# Patient Record
Sex: Female | Born: 1935 | Race: Black or African American | Hispanic: No
Health system: Southern US, Community
[De-identification: ages and names within clinical notes are randomized; demographics above are authoritative.]

## PROBLEM LIST (undated history)

## (undated) DIAGNOSIS — M1990 Unspecified osteoarthritis, unspecified site: Secondary | ICD-10-CM

## (undated) DIAGNOSIS — M109 Gout, unspecified: Secondary | ICD-10-CM

## (undated) DIAGNOSIS — F0390 Unspecified dementia without behavioral disturbance: Secondary | ICD-10-CM

## (undated) DIAGNOSIS — R1310 Dysphagia, unspecified: Secondary | ICD-10-CM

## (undated) DIAGNOSIS — D649 Anemia, unspecified: Secondary | ICD-10-CM

## (undated) DIAGNOSIS — E538 Deficiency of other specified B group vitamins: Secondary | ICD-10-CM

## (undated) DIAGNOSIS — I739 Peripheral vascular disease, unspecified: Secondary | ICD-10-CM

## (undated) DIAGNOSIS — I10 Essential (primary) hypertension: Secondary | ICD-10-CM

## (undated) DIAGNOSIS — R569 Unspecified convulsions: Secondary | ICD-10-CM

## (undated) HISTORY — DX: Unspecified osteoarthritis, unspecified site: M19.90

## (undated) HISTORY — DX: Unspecified convulsions: R56.9

## (undated) HISTORY — DX: Essential (primary) hypertension: I10

## (undated) HISTORY — DX: Deficiency of other specified B group vitamins: E53.8

## (undated) HISTORY — DX: Dysphagia, unspecified: R13.10

## (undated) HISTORY — DX: Peripheral vascular disease, unspecified: I73.9

## (undated) HISTORY — DX: Anemia, unspecified: D64.9

## (undated) HISTORY — DX: Gout, unspecified: M10.9

## (undated) HISTORY — DX: Unspecified dementia, unspecified severity, without behavioral disturbance, psychotic disturbance, mood disturbance, and anxiety: F03.90

---

## 2014-06-06 ENCOUNTER — Encounter: Payer: Self-pay | Admitting: Internal Medicine

## 2014-06-06 ENCOUNTER — Non-Acute Institutional Stay (SKILLED_NURSING_FACILITY): Payer: Medicaid Other | Admitting: Internal Medicine

## 2014-06-06 DIAGNOSIS — M1A9XX0 Chronic gout, unspecified, without tophus (tophi): Secondary | ICD-10-CM

## 2014-06-06 DIAGNOSIS — B354 Tinea corporis: Secondary | ICD-10-CM

## 2014-06-06 DIAGNOSIS — M1990 Unspecified osteoarthritis, unspecified site: Secondary | ICD-10-CM | POA: Insufficient documentation

## 2014-06-06 DIAGNOSIS — I10 Essential (primary) hypertension: Secondary | ICD-10-CM

## 2014-06-06 DIAGNOSIS — F03918 Unspecified dementia, unspecified severity, with other behavioral disturbance: Secondary | ICD-10-CM

## 2014-06-06 DIAGNOSIS — R0902 Hypoxemia: Secondary | ICD-10-CM

## 2014-06-06 DIAGNOSIS — M109 Gout, unspecified: Secondary | ICD-10-CM | POA: Insufficient documentation

## 2014-06-06 DIAGNOSIS — F0391 Unspecified dementia with behavioral disturbance: Secondary | ICD-10-CM | POA: Insufficient documentation

## 2014-06-06 DIAGNOSIS — J9611 Chronic respiratory failure with hypoxia: Secondary | ICD-10-CM

## 2014-06-06 DIAGNOSIS — J9610 Chronic respiratory failure, unspecified whether with hypoxia or hypercapnia: Secondary | ICD-10-CM

## 2014-06-06 DIAGNOSIS — M1A00X0 Idiopathic chronic gout, unspecified site, without tophus (tophi): Secondary | ICD-10-CM

## 2014-06-06 NOTE — Progress Notes (Signed)
MRN: [id number] Name: Kaitlyn Curry  Sex: female Age: 80 y.o. DOB: 1928/07/25  [facility] #: [facility] Facility/Room: 205D Level Of Care: SNF Provider: Merrilee Seashore D Emergency Contacts: No emergency contact information on file.  Code Status: DNR  Allergies: Ppd and Shellfish allergy  Chief Complaint  Patient presents with  . New Admit To SNF    HPI: Patient is 80 y.o. female who is admitted to SNF from another facility in [place].  Past Medical History  Diagnosis Date  . Dementia without behavioral disturbance   . Arthritis   . Gout   . Dysphagia   . PVD (peripheral vascular disease)   . Hypertension   . Anemia   . Vitamin B12 deficiency   . Seizures     No past surgical history on file.    Medication List       This list is accurate as of: 06/06/14 11:59 PM.  Always use your most recent med list.               allopurinol 100 MG tablet  Commonly known as:  ZYLOPRIM  Take 100 mg by mouth daily.     amLODipine 10 MG tablet  Commonly known as:  NORVASC  Take 10 mg by mouth daily.     ammonium lactate 12 % lotion  Commonly known as:  LAC-HYDRIN  Apply 1 application topically daily.     atropine 1 % ophthalmic solution  Place 2 drops under the tongue 4 (four) times daily as needed.     CALCIUM CITRATE + D3 250-200 MG-UNIT Tabs  Generic drug:  Calcium Citrate-Vitamin D  Take 2 tablets by mouth 2 (two) times daily.     chlorhexidine 0.12 % solution  Commonly known as:  PERIDEX  Use as directed 15 mLs in the mouth or throat 2 (two) times daily.     ciclopirox 8 % solution  Commonly known as:  PENLAC  Apply topically at bedtime. Apply over nail and surrounding skin. Apply daily over previous coat. After seven (7) days, may remove with alcohol and continue cycle.     [employer] SALINE NASAL SPRAY NA  Place 1 spray into the nose 3 (three) times daily.     diclofenac sodium 1 % Gel  Commonly known as:  VOLTAREN  Apply 2 g topically daily. Knees and L hip     guaiFENesin 100 MG/5ML liquid  Commonly known as:  ROBITUSSIN  Take 200 mg by mouth 4 (four) times daily as needed for cough.     ipratropium-albuterol 0.5-2.5 (3) MG/3ML Soln  Commonly known as:  DUONEB  Take 3 mLs by nebulization 3 (three) times daily.     iron polysaccharides 150 MG capsule  Commonly known as:  NIFEREX  Take 150 mg by mouth every other day.     IRON-B12-VITAMINS IM  Inject 1 mL into the muscle every 7 (seven) days.     lactulose 10 GM/15ML solution  Commonly known as:  CHRONULAC  Take 45 g by mouth 3 (three) times daily.     lamoTRIgine 100 MG tablet  Commonly known as:  LAMICTAL  Take 50 mg by mouth 2 (two) times daily.     LORazepam 0.5 MG tablet  Commonly known as:  ATIVAN  Take 0.5 mg by mouth once. Prior to dr's appt     LORazepam 0.5 MG tablet  Commonly known as:  ATIVAN  Take 0.5 mg by mouth every 4 (four) hours as needed for anxiety.  multivitamin capsule  Take 1 capsule by mouth daily.     nystatin 100000 UNIT/GM Powd  Apply topically 2 (two) times daily.     OXYGEN  Inhale 2 L into the lungs as needed. O2<93     ranitidine 150 MG tablet  Commonly known as:  ZANTAC  Take 150 mg by mouth daily as needed for heartburn.     triamterene-hydrochlorothiazide 37.5-25 MG per tablet  Commonly known as:  MAXZIDE-25  Take 1 tablet by mouth daily.     WATER ORAL PO  Take 240 mLs by mouth 3 (three) times daily.        Meds ordered this encounter  Medications  . LORazepam (ATIVAN) 0.5 MG tablet    Sig: Take 0.5 mg by mouth once. Prior to dr's appt  . allopurinol (ZYLOPRIM) 100 MG tablet    Sig: Take 100 mg by mouth daily.  Marland Kitchen amLODipine (NORVASC) 10 MG tablet    Sig: Take 10 mg by mouth daily.  . [employer]S SALINE NASAL SPRAY NA    Sig: Place 1 spray into the nose 3 (three) times daily.  . Calcium Citrate-Vitamin D (CALCIUM CITRATE + D3) 250-200 MG-UNIT TABS    Sig: Take 2 tablets by mouth 2 (two) times daily.  . chlorhexidine (PERIDEX) 0.12  % solution    Sig: Use as directed 15 mLs in the mouth or throat 2 (two) times daily.  Marland Kitchen IRON-B12-VITAMINS IM    Sig: Inject 1 mL into the muscle every 7 (seven) days.  . iron polysaccharides (NIFEREX) 150 MG capsule    Sig: Take 150 mg by mouth every other day.  . lactulose (CHRONULAC) 10 GM/15ML solution    Sig: Take 45 g by mouth 3 (three) times daily.  Marland Kitchen lamoTRIgine (LAMICTAL) 100 MG tablet    Sig: Take 50 mg by mouth 2 (two) times daily.  . Multiple Vitamin (MULTIVITAMIN) capsule    Sig: Take 1 capsule by mouth daily.  Marland Kitchen triamterene-hydrochlorothiazide (MAXZIDE-25) 37.5-25 MG per tablet    Sig: Take 1 tablet by mouth daily.  Marland Kitchen ipratropium-albuterol (DUONEB) 0.5-2.5 (3) MG/3ML SOLN    Sig: Take 3 mLs by nebulization 3 (three) times daily.  [employer] (WATER ORAL PO)    Sig: Take 240 mLs by mouth 3 (three) times daily.  Marland Kitchen ammonium lactate (LAC-HYDRIN) 12 % lotion    Sig: Apply 1 application topically daily.  . diclofenac sodium (VOLTAREN) 1 % GEL    Sig: Apply 2 g topically daily. Knees and L hip  . nystatin (MYCOSTATIN/NYSTOP) 100000 UNIT/GM POWD    Sig: Apply topically 2 (two) times daily.  . ciclopirox (PENLAC) 8 % solution    Sig: Apply topically at bedtime. Apply over nail and surrounding skin. Apply daily over previous coat. After seven (7) days, may remove with alcohol and continue cycle.  Marland Kitchen atropine 1 % ophthalmic solution    Sig: Place 2 drops under the tongue 4 (four) times daily as needed.  Marland Kitchen guaiFENesin (ROBITUSSIN) 100 MG/5ML liquid    Sig: Take 200 mg by mouth 4 (four) times daily as needed for cough.  . ranitidine (ZANTAC) 150 MG tablet    Sig: Take 150 mg by mouth daily as needed for heartburn.  Marland Kitchen LORazepam (ATIVAN) 0.5 MG tablet    Sig: Take 0.5 mg by mouth every 4 (four) hours as needed for anxiety.  . OXYGEN    Sig: Inhale 2 L into the lungs as needed. O2<93  There is no immunization history on file for this patient.  History  Substance Use  Topics  . Smoking status: Unknown If Ever Smoked  . Smokeless tobacco: Not on file  . Alcohol Use: Not on file    Family history is noncontributory    Review of Systems    UTO   Filed Vitals:   06/06/14 2109  BP: 131/79  Pulse: 82  Temp: 97 F (36.1 C)  Resp: 18    Physical Exam  GENERAL APPEARANCE: Alert, non conversant. No acute distress.  SKIN: No diaphoresis rash,  HEAD: Normocephalic, atraumatic  EYES: pt did not open eyes  EARS: External exam WNL, canals clear NOSE: No deformity or discharge.  MOUTH/THROAT: Lips w/o lesions.  RESPIRATORY: Breathing is even, unlabored. Lung sounds are clear   CARDIOVASCULAR: Heart RRR no murmurs, rubs or gallops. No peripheral edema.  GASTROINTESTINAL: Abdomen is soft, non-tender, not distended w/ normal bowel sounds GENITOURINARY: Bladder non tender, not distended  MUSCULOSKELETAL: No abnormal joints or musculature NEUROLOGIC: Cranial nerves 2-12 grossly intact. Moves all extremities no  PSYCHIATRIC: dementia, no behavioral issues  Patient Active Problem List   Diagnosis Date Noted  . Tinea corporis 06/08/2014  . Chronic respiratory failure with hypoxia 06/08/2014  . Dementia with behavioral disturbance   . Arthritis   . Gout   . Hypertension        Assessment and Plan  Hypertension Continue maxide and norvasc  Dementia with behavioral disturbance Continue ativan and lamictal  Gout Continue allopurinol  Tinea corporis Continue nystatin  Chronic respiratory failure with hypoxia Continue duoneb and O2    Margit Hanks, MD

## 2014-06-08 ENCOUNTER — Encounter: Payer: Self-pay | Admitting: Internal Medicine

## 2014-06-08 DIAGNOSIS — B352 Tinea manuum: Secondary | ICD-10-CM

## 2014-06-08 DIAGNOSIS — J9611 Chronic respiratory failure with hypoxia: Secondary | ICD-10-CM | POA: Insufficient documentation

## 2014-06-08 DIAGNOSIS — B353 Tinea pedis: Secondary | ICD-10-CM

## 2014-06-08 DIAGNOSIS — B351 Tinea unguium: Secondary | ICD-10-CM | POA: Insufficient documentation

## 2014-06-08 NOTE — Assessment & Plan Note (Signed)
Continue allopurinol 

## 2014-06-08 NOTE — Assessment & Plan Note (Signed)
Continue maxide and norvasc

## 2014-06-08 NOTE — Assessment & Plan Note (Signed)
Continue ativan and lamictal

## 2014-06-08 NOTE — Assessment & Plan Note (Signed)
Continue duoneb and O2

## 2014-06-08 NOTE — Assessment & Plan Note (Signed)
Continue nystatin °

## 2014-06-11 ENCOUNTER — Other Ambulatory Visit: Payer: Self-pay | Admitting: *Deleted

## 2014-06-11 MED ORDER — LORAZEPAM 0.5 MG PO TABS: 0.5000 mg | ORAL_TABLET | ORAL | Status: DC | PRN

## 2014-06-11 NOTE — Telephone Encounter (Signed)
[facility] 

## 2014-06-14 ENCOUNTER — Emergency Department (HOSPITAL_COMMUNITY)
Admission: EM | Admit: 2014-06-14 | Discharge: 2014-06-15 | Disposition: A | Discharge status: Skilled Nursing Facility | Payer: Medicare Other | Attending: Emergency Medicine | Admitting: Emergency Medicine

## 2014-06-14 ENCOUNTER — Emergency Department (HOSPITAL_COMMUNITY): Payer: Medicare Other

## 2014-06-14 ENCOUNTER — Encounter (HOSPITAL_COMMUNITY): Payer: Self-pay | Admitting: Emergency Medicine

## 2014-06-14 DIAGNOSIS — W06XXXA Fall from bed, initial encounter: Secondary | ICD-10-CM | POA: Insufficient documentation

## 2014-06-14 DIAGNOSIS — I10 Essential (primary) hypertension: Secondary | ICD-10-CM | POA: Insufficient documentation

## 2014-06-14 DIAGNOSIS — S0990XA Unspecified injury of head, initial encounter: Secondary | ICD-10-CM | POA: Diagnosis not present

## 2014-06-14 DIAGNOSIS — Z862 Personal history of diseases of the blood and blood-forming organs and certain disorders involving the immune mechanism: Secondary | ICD-10-CM | POA: Insufficient documentation

## 2014-06-14 DIAGNOSIS — Z8739 Personal history of other diseases of the musculoskeletal system and connective tissue: Secondary | ICD-10-CM | POA: Diagnosis not present

## 2014-06-14 DIAGNOSIS — F0390 Unspecified dementia without behavioral disturbance: Secondary | ICD-10-CM | POA: Diagnosis not present

## 2014-06-14 DIAGNOSIS — W19XXXA Unspecified fall, initial encounter: Secondary | ICD-10-CM

## 2014-06-14 DIAGNOSIS — Y9389 Activity, other specified: Secondary | ICD-10-CM | POA: Diagnosis not present

## 2014-06-14 DIAGNOSIS — M109 Gout, unspecified: Secondary | ICD-10-CM | POA: Insufficient documentation

## 2014-06-14 DIAGNOSIS — Z79899 Other long term (current) drug therapy: Secondary | ICD-10-CM | POA: Diagnosis not present

## 2014-06-14 DIAGNOSIS — Y9289 Other specified places as the place of occurrence of the external cause: Secondary | ICD-10-CM | POA: Insufficient documentation

## 2014-06-14 DIAGNOSIS — G40909 Epilepsy, unspecified, not intractable, without status epilepticus: Secondary | ICD-10-CM | POA: Insufficient documentation

## 2014-06-14 DIAGNOSIS — Y92129 Unspecified place in nursing home as the place of occurrence of the external cause: Secondary | ICD-10-CM

## 2014-06-14 NOTE — ED Notes (Signed)
Pt from [facility]. Has history of severe dementia. Rolled off bed and found on floor around 1930 this evening. Pt is nonverbal. Has hematoma to right cheek and eye with old blood in right nostril. Family wants social worker to place pt in another facility and requests that pt stay in hospital until social work can do so. Pt is contracted ann accurate BP and O2 sats unable to be obtained due to pt's contracture of arms.

## 2014-06-14 NOTE — ED Provider Notes (Signed)
CSN: [id number]     Arrival date & time 06/14/14  2035 History   First MD Initiated Contact with Patient 06/14/14 2052     Chief Complaint  Patient presents with  . Fall     (Consider location/radiation/quality/duration/timing/severity/associated sxs/prior Treatment) HPI Comments: Patient is brought from [facility] after a fall. She has a history of marked dementia. She is nonverbal and nonambulatory. She was found on the floor after she rolled out of bed this evening. Per the family she is at her normal mental status. She is normally nonverbal. The daughter was with the patient earlier today and said that she was at her baseline mental status. She's not had any recent fevers vomiting or other recent illnesses. History is limited due to dementia.   Past Medical History  Diagnosis Date  . Dementia without behavioral disturbance   . Arthritis   . Gout   . Dysphagia   . PVD (peripheral vascular disease)   . Hypertension   . Anemia   . Vitamin B12 deficiency   . Seizures    History reviewed. No pertinent past surgical history. No family history on file. History  Substance Use Topics  . Smoking status: Unknown If Ever Smoked  . Smokeless tobacco: Not on file  . Alcohol Use: Not on file   OB History   Grav Para Term Preterm Abortions TAB SAB Ect Mult Living                 Review of Systems  Unable to perform ROS: Dementia      Allergies  Ppd and Shellfish allergy  Home Medications   Prior to Admission medications   Medication Sig Start Date End Date Taking? Authorizing Provider  allopurinol (ZYLOPRIM) 100 MG tablet Take 100 mg by mouth daily.   Yes Historical Provider, MD  lactulose (CHRONULAC) 10 GM/15ML solution Take 45 g by mouth 3 (three) times daily.   Yes Historical Provider, MD  lamoTRIgine (LAMICTAL) 100 MG tablet Take 50 mg by mouth 2 (two) times daily.   Yes Historical Provider, MD  triamterene-hydrochlorothiazide (MAXZIDE-25) 37.5-25 MG per  tablet Take 1 tablet by mouth daily.   Yes Historical Provider, MD   BP 138/78  Pulse 80  Temp(Src) 98.2 F (36.8 C) (Oral)  Resp 22  Ht 5\' 6"  (1.676 m)  Wt 152 lb (68.947 kg)  BMI 24.55 kg/m2  SpO2 99% Physical Exam  Constitutional: She appears well-developed and well-nourished.  HENT:  Head: Normocephalic.  Abrasion, small hematoma to frontal scalp  Eyes: Pupils are equal, round, and reactive to light.  Neck:  No obvious tenderness to cervical thoracic or lumbosacral spine  Cardiovascular: Normal rate, regular rhythm and normal heart sounds.   Pulmonary/Chest: Effort normal and breath sounds normal. No respiratory distress. She has no wheezes. She has no rales. She exhibits no tenderness.  Abdominal: Soft. Bowel sounds are normal. There is no tenderness. There is no rebound and no guarding.  Musculoskeletal: Normal range of motion. She exhibits no edema.  Patient with contractures not extremities but no evident discomfort on movement of extremities. There's no areas of deformity or swelling noted.  Lymphadenopathy:    She has no cervical adenopathy.  Neurological:  Pt with eyes closed, mouth breathing.  Contractures to all extremities.  Nonverbal. No facial drooping.  Does not follow commands.  Skin: Skin is warm and dry. No rash noted.  She has an area of skin break down to her sacrum  Psychiatric: She  has a normal mood and affect.    ED Course  Procedures (including critical care time) Labs Review Labs Reviewed  URINE CULTURE  CBC WITH DIFFERENTIAL  COMPREHENSIVE METABOLIC PANEL  URINALYSIS, ROUTINE W REFLEX MICROSCOPIC    Imaging Review Dg Chest 1 View  06/14/2014   CLINICAL DATA:  Left rib pain.  EXAM: CHEST - 1 VIEW  COMPARISON:  None.  FINDINGS: There is marked elevation of the left hemidiaphragm, with left-sided atelectasis. The right lung appears clear. No definite pleural effusion or pneumothorax is seen.  The cardiomediastinal silhouette is borderline normal  in size. No displaced rib fractures are seen.  IMPRESSION: Marked elevation of the left hemidiaphragm, with left-sided atelectasis. No displaced rib fracture seen.   Electronically Signed   By: Roanna Raider M.D.   On: 06/14/2014 22:04   Ct Head Wo Contrast  06/14/2014   CLINICAL DATA:  Rolled off bed; right cheek hematoma and periorbital swelling. Concern for head or cervical spine injury.  EXAM: CT HEAD WITHOUT CONTRAST  CT MAXILLOFACIAL WITHOUT CONTRAST  CT CERVICAL SPINE WITHOUT CONTRAST  TECHNIQUE: Multidetector CT imaging of the head, cervical spine, and maxillofacial structures were performed using the standard protocol without intravenous contrast. Multiplanar CT image reconstructions of the cervical spine and maxillofacial structures were also generated.  COMPARISON:  None.  FINDINGS: CT HEAD FINDINGS  There is no evidence of acute infarction, mass lesion, or intra- or extra-axial hemorrhage on CT.  Prominence of the ventricles and sulci reflects moderately severe cortical volume loss. A region of chronic encephalomalacia is noted at the left temporal lobe. Cerebellar atrophy is noted. Diffuse periventricular and subcortical white matter change reflects small vessel ischemic microangiopathy.  The brainstem and fourth ventricle are within normal limits. The basal ganglia are unremarkable in appearance. No mass effect or midline shift is seen.  There is no evidence of fracture; a surgical defect is noted at the high left parietal calvarium. The visualized portions of the orbits are within normal limits. The paranasal sinuses and mastoid air cells are well-aerated. No significant soft tissue abnormalities are seen.  CT MAXILLOFACIAL FINDINGS  There is no evidence of fracture or dislocation. The maxilla and mandible appear intact. The nasal bone is unremarkable in appearance. The visualized dentition demonstrates no acute abnormality. A tiny osseous fragment adjacent to the left temporomandibular joint may  reflect remote injury or degenerative change.  The orbits are intact bilaterally. The visualized paranasal sinuses and mastoid air cells are well-aerated.  There is question of a laceration at the right eyelids. The parapharyngeal fat planes are preserved. The nasopharynx, oropharynx and hypopharynx are unremarkable in appearance. The visualized portions of the valleculae and piriform sinuses are grossly unremarkable.  The parotid and submandibular glands are within normal limits. No cervical lymphadenopathy is seen.  CT CERVICAL SPINE FINDINGS  There is no evidence of fracture or subluxation. Vertebral bodies demonstrate normal height and alignment. Intervertebral disc spaces are preserved. Anterior bridging osteophytes are noted at C6-C7. Prevertebral soft tissues are within normal limits. The visualized neural foramina are grossly unremarkable.  The thyroid gland is unremarkable in appearance. The visualized lung apices are clear. No significant soft tissue abnormalities are seen.  IMPRESSION: 1. No evidence of traumatic intracranial injury or fracture. 2. No evidence of fracture or dislocation evident maxillofacial structures. 3. No evidence of fracture or subluxation along the cervical spine. 4. Question of laceration at the right eyelids. 5. Moderately severe cortical volume loss. Chronic encephalomalacia noted at the left temporal  lobe. Diffuse small vessel ischemic microangiopathy seen. 6. Tiny osseous fragment adjacent to the left temporomandibular joint may reflect remote injury or degenerative change.   Electronically Signed   By: Roanna Raider M.D.   On: 06/14/2014 22:24   Ct Cervical Spine Wo Contrast  06/14/2014   CLINICAL DATA:  Rolled off bed; right cheek hematoma and periorbital swelling. Concern for head or cervical spine injury.  EXAM: CT HEAD WITHOUT CONTRAST  CT MAXILLOFACIAL WITHOUT CONTRAST  CT CERVICAL SPINE WITHOUT CONTRAST  TECHNIQUE: Multidetector CT imaging of the head, cervical  spine, and maxillofacial structures were performed using the standard protocol without intravenous contrast. Multiplanar CT image reconstructions of the cervical spine and maxillofacial structures were also generated.  COMPARISON:  None.  FINDINGS: CT HEAD FINDINGS  There is no evidence of acute infarction, mass lesion, or intra- or extra-axial hemorrhage on CT.  Prominence of the ventricles and sulci reflects moderately severe cortical volume loss. A region of chronic encephalomalacia is noted at the left temporal lobe. Cerebellar atrophy is noted. Diffuse periventricular and subcortical white matter change reflects small vessel ischemic microangiopathy.  The brainstem and fourth ventricle are within normal limits. The basal ganglia are unremarkable in appearance. No mass effect or midline shift is seen.  There is no evidence of fracture; a surgical defect is noted at the high left parietal calvarium. The visualized portions of the orbits are within normal limits. The paranasal sinuses and mastoid air cells are well-aerated. No significant soft tissue abnormalities are seen.  CT MAXILLOFACIAL FINDINGS  There is no evidence of fracture or dislocation. The maxilla and mandible appear intact. The nasal bone is unremarkable in appearance. The visualized dentition demonstrates no acute abnormality. A tiny osseous fragment adjacent to the left temporomandibular joint may reflect remote injury or degenerative change.  The orbits are intact bilaterally. The visualized paranasal sinuses and mastoid air cells are well-aerated.  There is question of a laceration at the right eyelids. The parapharyngeal fat planes are preserved. The nasopharynx, oropharynx and hypopharynx are unremarkable in appearance. The visualized portions of the valleculae and piriform sinuses are grossly unremarkable.  The parotid and submandibular glands are within normal limits. No cervical lymphadenopathy is seen.  CT CERVICAL SPINE FINDINGS  There is  no evidence of fracture or subluxation. Vertebral bodies demonstrate normal height and alignment. Intervertebral disc spaces are preserved. Anterior bridging osteophytes are noted at C6-C7. Prevertebral soft tissues are within normal limits. The visualized neural foramina are grossly unremarkable.  The thyroid gland is unremarkable in appearance. The visualized lung apices are clear. No significant soft tissue abnormalities are seen.  IMPRESSION: 1. No evidence of traumatic intracranial injury or fracture. 2. No evidence of fracture or dislocation evident maxillofacial structures. 3. No evidence of fracture or subluxation along the cervical spine. 4. Question of laceration at the right eyelids. 5. Moderately severe cortical volume loss. Chronic encephalomalacia noted at the left temporal lobe. Diffuse small vessel ischemic microangiopathy seen. 6. Tiny osseous fragment adjacent to the left temporomandibular joint may reflect remote injury or degenerative change.   Electronically Signed   By: Roanna Raider M.D.   On: 06/14/2014 22:24   Ct Maxillofacial Wo Cm  06/14/2014   CLINICAL DATA:  Rolled off bed; right cheek hematoma and periorbital swelling. Concern for head or cervical spine injury.  EXAM: CT HEAD WITHOUT CONTRAST  CT MAXILLOFACIAL WITHOUT CONTRAST  CT CERVICAL SPINE WITHOUT CONTRAST  TECHNIQUE: Multidetector CT imaging of the head, cervical spine, and maxillofacial structures  were performed using the standard protocol without intravenous contrast. Multiplanar CT image reconstructions of the cervical spine and maxillofacial structures were also generated.  COMPARISON:  None.  FINDINGS: CT HEAD FINDINGS  There is no evidence of acute infarction, mass lesion, or intra- or extra-axial hemorrhage on CT.  Prominence of the ventricles and sulci reflects moderately severe cortical volume loss. A region of chronic encephalomalacia is noted at the left temporal lobe. Cerebellar atrophy is noted. Diffuse  periventricular and subcortical white matter change reflects small vessel ischemic microangiopathy.  The brainstem and fourth ventricle are within normal limits. The basal ganglia are unremarkable in appearance. No mass effect or midline shift is seen.  There is no evidence of fracture; a surgical defect is noted at the high left parietal calvarium. The visualized portions of the orbits are within normal limits. The paranasal sinuses and mastoid air cells are well-aerated. No significant soft tissue abnormalities are seen.  CT MAXILLOFACIAL FINDINGS  There is no evidence of fracture or dislocation. The maxilla and mandible appear intact. The nasal bone is unremarkable in appearance. The visualized dentition demonstrates no acute abnormality. A tiny osseous fragment adjacent to the left temporomandibular joint may reflect remote injury or degenerative change.  The orbits are intact bilaterally. The visualized paranasal sinuses and mastoid air cells are well-aerated.  There is question of a laceration at the right eyelids. The parapharyngeal fat planes are preserved. The nasopharynx, oropharynx and hypopharynx are unremarkable in appearance. The visualized portions of the valleculae and piriform sinuses are grossly unremarkable.  The parotid and submandibular glands are within normal limits. No cervical lymphadenopathy is seen.  CT CERVICAL SPINE FINDINGS  There is no evidence of fracture or subluxation. Vertebral bodies demonstrate normal height and alignment. Intervertebral disc spaces are preserved. Anterior bridging osteophytes are noted at C6-C7. Prevertebral soft tissues are within normal limits. The visualized neural foramina are grossly unremarkable.  The thyroid gland is unremarkable in appearance. The visualized lung apices are clear. No significant soft tissue abnormalities are seen.  IMPRESSION: 1. No evidence of traumatic intracranial injury or fracture. 2. No evidence of fracture or dislocation evident  maxillofacial structures. 3. No evidence of fracture or subluxation along the cervical spine. 4. Question of laceration at the right eyelids. 5. Moderately severe cortical volume loss. Chronic encephalomalacia noted at the left temporal lobe. Diffuse small vessel ischemic microangiopathy seen. 6. Tiny osseous fragment adjacent to the left temporomandibular joint may reflect remote injury or degenerative change.   Electronically Signed   By: Roanna Raider M.D.   On: 06/14/2014 22:24     EKG Interpretation None      MDM   Final diagnoses:  Fall at nursing home, initial encounter  Head injury, initial encounter    Patient presents after a fall from a nursing home. Her CT of her head cervical spine and face don't reveal any acute injuries. There is no noted lacerations to her eyelids. I did do a chest x-ray which shows elevation of her left hemidiaphragm. There is no old imaging studies to compare to. She has normal oxygen saturation and no increased work of breathing. The family wants the patient admitted because they're unhappy with the current nursing home facility. I did explain to the family that we can't admit the patient is there is no underlying reason for hospitalization. They now state that the patient seems less responsive than normal. Given this I will check some basic labs and a urinalysis.  Dr. Judd Lien to follow.  Rolan Bucco, MD 06/15/14 [phone]

## 2014-06-14 NOTE — Discharge Instructions (Signed)

## 2014-06-14 NOTE — ED Notes (Signed)
Family wants to speak with Dr. Fredderick Phenix before pt gets discharged.

## 2014-06-15 LAB — CBC WITH DIFFERENTIAL/PLATELET
BASOS ABS: 0 10*3/uL (ref 0.0–0.1)
Basophils Relative: 0 % (ref 0–1)
Eosinophils Absolute: 0 10*3/uL (ref 0.0–0.7)
Eosinophils Relative: 0 % (ref 0–5)
HCT: 34.8 % — ABNORMAL LOW (ref 36.0–46.0)
Hemoglobin: 10.8 g/dL — ABNORMAL LOW (ref 12.0–15.0)
LYMPHS ABS: 0.9 10*3/uL (ref 0.7–4.0)
LYMPHS PCT: 15 % (ref 12–46)
MCH: 24.7 pg — ABNORMAL LOW (ref 26.0–34.0)
MCHC: 31 g/dL (ref 30.0–36.0)
MCV: 79.5 fL (ref 78.0–100.0)
MONO ABS: 0.4 10*3/uL (ref 0.1–1.0)
Monocytes Relative: 7 % (ref 3–12)
Neutro Abs: 4.6 10*3/uL (ref 1.7–7.7)
Neutrophils Relative %: 78 % — ABNORMAL HIGH (ref 43–77)
Platelets: 134 10*3/uL — ABNORMAL LOW (ref 150–400)
RBC: 4.38 MIL/uL (ref 3.87–5.11)
RDW: 15.5 % (ref 11.5–15.5)
WBC: 5.9 10*3/uL (ref 4.0–10.5)

## 2014-06-15 LAB — URINALYSIS, ROUTINE W REFLEX MICROSCOPIC
Bilirubin Urine: NEGATIVE
Glucose, UA: NEGATIVE mg/dL
Hgb urine dipstick: NEGATIVE
Ketones, ur: 15 mg/dL — AB
Nitrite: NEGATIVE
Protein, ur: NEGATIVE mg/dL
Specific Gravity, Urine: 1.02 (ref 1.005–1.030)
UROBILINOGEN UA: 1 mg/dL (ref 0.0–1.0)
pH: 6.5 (ref 5.0–8.0)

## 2014-06-15 LAB — COMPREHENSIVE METABOLIC PANEL
ALT: 6 U/L (ref 0–35)
AST: 15 U/L (ref 0–37)
Albumin: 3.2 g/dL — ABNORMAL LOW (ref 3.5–5.2)
Alkaline Phosphatase: 62 U/L (ref 39–117)
Anion gap: 12 (ref 5–15)
BUN: 22 mg/dL (ref 6–23)
CO2: 27 meq/L (ref 19–32)
CREATININE: 1.41 mg/dL — AB (ref 0.50–1.10)
Calcium: 9 mg/dL (ref 8.4–10.5)
Chloride: 102 mEq/L (ref 96–112)
GFR calc Af Amer: 38 mL/min — ABNORMAL LOW (ref >90)
GFR, EST NON AFRICAN AMERICAN: 33 mL/min — AB (ref >90)
GLUCOSE: 106 mg/dL — AB (ref 70–99)
Potassium: 4.2 mEq/L (ref 3.7–5.3)
Sodium: 141 mEq/L (ref 137–147)
TOTAL PROTEIN: 7 g/dL (ref 6.0–8.3)
Total Bilirubin: 0.2 mg/dL — ABNORMAL LOW (ref 0.3–1.2)

## 2014-06-15 LAB — URINE MICROSCOPIC-ADD ON

## 2014-06-15 NOTE — ED Notes (Signed)
Report given to Ginger at [facility]

## 2014-06-15 NOTE — ED Provider Notes (Signed)
[facility] from Dr. Fredderick Phenix at shift change awaiting results of urinalysis. Urinalysis was returned showing no evidence for infection as the cause of her mental status. She appears to be resting comfortably and I feel she is appropriate for return to [facility]. I spoke with the family. They are in the process of acquiring a new nursing home and will investigate this in the coming days. I see no indication for admission.  Geoffery Lyons, MD 06/15/14 [phone]

## 2014-06-15 NOTE — ED Notes (Signed)
Waiting on PT to take patient back to [facility].

## 2014-06-16 ENCOUNTER — Non-Acute Institutional Stay (SKILLED_NURSING_FACILITY): Payer: Medicaid Other | Admitting: Internal Medicine

## 2014-06-16 DIAGNOSIS — Y92129 Unspecified place in nursing home as the place of occurrence of the external cause: Secondary | ICD-10-CM

## 2014-06-16 DIAGNOSIS — R52 Pain, unspecified: Secondary | ICD-10-CM

## 2014-06-16 DIAGNOSIS — W19XXXD Unspecified fall, subsequent encounter: Secondary | ICD-10-CM

## 2014-06-16 DIAGNOSIS — F0391 Unspecified dementia with behavioral disturbance: Secondary | ICD-10-CM

## 2014-06-16 DIAGNOSIS — Z5189 Encounter for other specified aftercare: Secondary | ICD-10-CM

## 2014-06-16 DIAGNOSIS — F03918 Unspecified dementia, unspecified severity, with other behavioral disturbance: Secondary | ICD-10-CM

## 2014-06-16 DIAGNOSIS — W19XXXA Unspecified fall, initial encounter: Secondary | ICD-10-CM

## 2014-06-16 LAB — URINE CULTURE: Colony Count: >=100000

## 2014-06-17 ENCOUNTER — Telehealth (HOSPITAL_BASED_OUTPATIENT_CLINIC_OR_DEPARTMENT_OTHER): Payer: Self-pay | Admitting: Emergency Medicine

## 2014-06-17 NOTE — Telephone Encounter (Signed)
Post ED Visit - Positive Culture Follow-up  Culture report reviewed by antimicrobial stewardship pharmacist:  Wes Dulaney, Pharm.D., BCPS  Celedonio Miyamoto, Pharm.D., BCPS  Georgina Pillion, Pharm.D., BCPS  [place], [address].D., BCPS, AAHIVP  Estella Husk, Pharm.D., BCPS, AAHIVP  Carly Sabat, Pharm.D.  Enzo Bi, [address].D.  Positive urine culture >100,000 colonies/ml Viridians streptococcus Treated with none, no further patient follow-up is required at this time, no treatment per [facility]  Berle Mull 06/17/2014, 12:33 PM

## 2014-06-22 DIAGNOSIS — W19XXXA Unspecified fall, initial encounter: Secondary | ICD-10-CM | POA: Insufficient documentation

## 2014-06-22 DIAGNOSIS — Y92129 Unspecified place in nursing home as the place of occurrence of the external cause: Secondary | ICD-10-CM

## 2014-06-22 DIAGNOSIS — R0781 Pleurodynia: Secondary | ICD-10-CM | POA: Insufficient documentation

## 2014-06-22 DIAGNOSIS — R0789 Other chest pain: Secondary | ICD-10-CM | POA: Insufficient documentation

## 2014-06-22 NOTE — Progress Notes (Signed)
Patient ID: Kaitlyn Curry, female   DOB: 12/12/1927, 80 y.o.   MRN: [id number]   This is an acute visit.  Level of care skilled.  Facility AF.  Chief complaint-acute visit followup fall over the weekend.  History of present illness.  Patient is an 80 year old female recently admitted to this facility from another facility apparently in [place].  She has significant dementia and apparently fell over the weekend-apparently there were some concerns of pain and she was sent to the ER-workup did not show really any an acute process-CT of the head did not show an acute change   Chest x-ray showed left-sided atelectasis no displaced ribs-urinalysis appeared to be negative.  She was returned to facility and according to nursing staff is at her baseline.  She is followed by hospice as well who saw her earlier today.  Family medical social history as been reviewed per admission note on 06/14/2014.  Medications have been reviewed per MAR.  Review of systems unobtainable secondary to patient has not really speaking this evening.  Physical exam.  In general this is a frail elderly female who appears to be resting comfortably in bed she does not appear to be verbal this evening.  Her skin is warm and dry did not really note any increased bruising or bleeding.  Chest is clear to auscultation with poor respiratory effort no labored breathing.  Heart is regular rate and rhythm without murmur gallop or rub.  Abdomen appears to be soft and nontender with positive bowel sounds.  Muscle skeletal -I do not note any significant pain with gentle range of motion of her extremities--although she appears somewhat agitated with invasive maneuvers---she does have boots applied to her feet bilaterally-she appears to have contractures.  Neurologic is difficult exam she appears to have severe dementia cranial nerves appear to be grossly intact she is not verbal I do not see any lateralizing findings although  again difficult exam since she does not follow verbal commands and is lying in bed  Labs.  Her hospital-WBC 5.9 hemoglobin 10.8 platelets 134.  Sodium 141 potassium 4.2 BUN 22 creatinine 1.41.  Assessment and plan.  #1-fall with ER visit-workup appeared to be negative in the ER-according to nursing staff she appears to be at baseline-she does have Tylenol when necessary for pain-she is followed by hospice as well at this point will monitor she does not appear to be in any distress and appears to be fairly comfortable this evening will have to be watched however.  [id number]

## 2014-06-30 ENCOUNTER — Encounter: Payer: Self-pay | Admitting: *Deleted

## 2014-08-07 ENCOUNTER — Non-Acute Institutional Stay (SKILLED_NURSING_FACILITY): Payer: Medicaid Other | Admitting: Internal Medicine

## 2014-08-07 DIAGNOSIS — H9221 Otorrhagia, right ear: Secondary | ICD-10-CM

## 2014-08-07 DIAGNOSIS — H9220 Otorrhagia, unspecified ear: Secondary | ICD-10-CM | POA: Insufficient documentation

## 2014-08-07 DIAGNOSIS — F0391 Unspecified dementia with behavioral disturbance: Secondary | ICD-10-CM

## 2014-08-07 DIAGNOSIS — F03918 Unspecified dementia, unspecified severity, with other behavioral disturbance: Secondary | ICD-10-CM

## 2014-08-07 DIAGNOSIS — M1000 Idiopathic gout, unspecified site: Secondary | ICD-10-CM

## 2014-08-07 DIAGNOSIS — I10 Essential (primary) hypertension: Secondary | ICD-10-CM

## 2014-08-07 NOTE — Progress Notes (Signed)
Patient ID: Kaitlyn Curry, female   DOB: 06-11-1928, 80 y.o.   MRN: [id number]                                                            .    Allergies: Ppd and Shellfish allergy  Chief Complaint  Patient presents with  . medical management of chronic medical conditions including dementia-hypertension-seizure disorder-gout-.  Acute visit secondary to blood in ear canal on the right    HPI: Patient is 80 y.o. female who is admitted to SNF from another facility in [place]--she has severe dementia along with the above her above-stated issues-her stay here has been relatively unremarkable she does have a fall facility and did go to the ER were no acute changes were noted.  She does have a history of seizure disorder there've been none to my knowledge during her stay here. Graft she also has a history of gout is on L upper on all.  Also has a history of hypertension she is on Norvasc as well as Maxide recent blood pressures 134/86-141/80-132/95.  Apparently yesterday was noted patient had a small amount of dried blood in her right ear canal-I did look at it today and there appears to be some fresh blood around the tympanic membrane she also appears to have possibly to have a small amount of blood or exudate that is dried on her left ear.  I did discuss this with her daughter at bedside as well as with nursing staff and we will try to get an ear nose mouth and throat consult today.  Apparently there's been no history of recent trauma-otherwise patient appears to be at her baseline  Past Medical History  Diagnosis Date  . Dementia without behavioral disturbance   . Arthritis   . Gout   . Dysphagia   . PVD (peripheral vascular disease)   . Hypertension   . Anemia   . Vitamin B12 deficiency   . Seizures        Medication List                     allopurinol 100 MG tablet  Commonly known as:  ZYLOPRIM  Take 100 mg by mouth daily.     amLODipine 10 MG tablet  Commonly known as: NORVASC  Take 10 mg by mouth daily.     ammonium lactate 12 % lotion  Commonly known as: LAC-HYDRIN  Apply 1 application topically daily.     atropine 1 % ophthalmic solution  Place 2 drops under the tongue 4 (four) times daily as needed.     CALCIUM CITRATE + D3 250-200 MG-UNIT Tabs  Generic drug: Calcium Citrate-Vitamin D  Take 2 tablets by mouth 2 (two) times daily.     chlorhexidine 0.12 % solution  Commonly known as: PERIDEX  Use as directed 15 mLs in the mouth or throat 2 (two) times daily.     ciclopirox 8 % solution  Commonly known as: PENLAC  Apply topically at bedtime. Apply over nail and surrounding skin. Apply daily over previous coat. After seven (7) days, may remove with alcohol and continue cycle.     [employer] SALINE NASAL SPRAY NA  Place 1 spray into the nose 3 (three) times daily.     diclofenac sodium  1 % Gel  Commonly known as: VOLTAREN  Apply 2 g topically daily. Knees and L hip     guaiFENesin 100 MG/5ML liquid  Commonly known as: ROBITUSSIN  Take 200 mg by mouth 4 (four) times daily as needed for cough.     ipratropium-albuterol 0.5-2.5 (3) MG/3ML Soln  Commonly known as: DUONEB  Take 3 mLs by nebulization 3 (three) times daily.     iron polysaccharides 150 MG capsule  Commonly known as: NIFEREX  Take 150 mg by mouth every other day.     IRON-B12-VITAMINS IM  Inject 1 mL into the muscle every 7 (seven) days.     lactulose 10 GM/15ML solution  Commonly known as: CHRONULAC  Take 45 g by mouth 3 (three) times daily.     lamoTRIgine 100 MG tablet  Commonly known as: LAMICTAL  Take 50 mg by mouth 2 (two) times daily.     LORazepam 0.5 MG tablet  Commonly known as: ATIVAN  Take 0.5 mg by mouth once. Prior to dr's appt     LORazepam 0.5 MG tablet  Commonly known as: ATIVAN   Take 0.5 mg by mouth every 4 (four) hours as needed for anxiety.     multivitamin capsule  Take 1 capsule by mouth daily.     nystatin 100000 UNIT/GM Powd  Apply topically 2 (two) times daily.     OXYGEN  Inhale 2 L into the lungs as needed. O2<93     ranitidine 150 MG tablet  Commonly known as: ZANTAC  Take 150 mg by mouth daily as needed for heartburn.     triamterene-hydrochlorothiazide 37.5-25 MG per tablet  Commonly known as: MAXZIDE-25  Take 1 tablet by mouth daily.     WATER ORAL PO  Take 240 mLs by mouth 3 (three) times daily.        Meds ordered this encounter  Medications  . LORazepam (ATIVAN) 0.5 MG tablet    Sig: Take 0.5 mg by mouth once. Prior to dr's appt  . allopurinol (ZYLOPRIM) 100 MG tablet    Sig: Take 100 mg by mouth daily.  Marland Kitchen amLODipine (NORVASC) 10 MG tablet    Sig: Take 10 mg by mouth daily.  . [employer] SALINE NASAL SPRAY NA    Sig: Place 1 spray into the nose 3 (three) times daily.  . Calcium Citrate-Vitamin D (CALCIUM CITRATE + D3) 250-200 MG-UNIT TABS    Sig: Take 2 tablets by mouth 2 (two) times daily.  . chlorhexidine (PERIDEX) 0.12 % solution    Sig: Use as directed 15 mLs in the mouth or throat 2 (two) times daily.  Marland Kitchen IRON-B12-VITAMINS IM    Sig: Inject 1 mL into the muscle every 7 (seven) days.  . iron polysaccharides (NIFEREX) 150 MG capsule    Sig: Take 150 mg by mouth every other day.  . lactulose (CHRONULAC) 10 GM/15ML solution    Sig: Take 45 g by mouth 3 (three) times daily.  Marland Kitchen lamoTRIgine (LAMICTAL) 100 MG tablet    Sig: Take 50 mg by mouth 2 (two) times daily.  . Multiple Vitamin (MULTIVITAMIN) capsule    Sig: Take 1 capsule by mouth daily.  Marland Kitchen triamterene-hydrochlorothiazide (MAXZIDE-25) 37.5-25 MG per tablet    Sig: Take 1 tablet by mouth daily.  Marland Kitchen ipratropium-albuterol (DUONEB) 0.5-2.5 (3) MG/3ML SOLN    Sig: Take 3 mLs by  nebulization 3 (three) times daily.  [employer] (WATER ORAL PO)    Sig: Take 240 mLs by  mouth 3 (three) times daily.  Marland Kitchen. ammonium lactate (LAC-HYDRIN) 12 % lotion    Sig: Apply 1 application topically daily.  . diclofenac sodium (VOLTAREN) 1 % GEL    Sig: Apply 2 g topically daily. Knees and L hip  . nystatin (MYCOSTATIN/NYSTOP) 100000 UNIT/GM POWD    Sig: Apply topically 2 (two) times daily.  . ciclopirox (PENLAC) 8 % solution    Sig: Apply topically at bedtime. Apply over nail and surrounding skin. Apply daily over previous coat. After seven (7) days, may remove with alcohol and continue cycle.  Marland Kitchen. atropine 1 % ophthalmic solution    Sig: Place 2 drops under the tongue 4 (four) times daily as needed.  Marland Kitchen. guaiFENesin (ROBITUSSIN) 100 MG/5ML liquid    Sig: Take 200 mg by mouth 4 (four) times daily as needed for cough.  . ranitidine (ZANTAC) 150 MG tablet    Sig: Take 150 mg by mouth daily as needed for heartburn.  Marland Kitchen. LORazepam (ATIVAN) 0.5 MG tablet    Sig: Take 0.5 mg by mouth every 4 (four) hours as needed for anxiety.  . OXYGEN    Sig: Inhale 2 L into the lungs as needed. O2<93     There is no immunization history on file for this patient.  History  Substance Use Topics  . Smoking status: Unknown If Ever Smoked  . Smokeless tobacco: Not on file  . Alcohol Use: Not on file    Family history is noncontributory   Review of Systems UTO--please see history of present illness again patient appears to be at her baseline                       Physical Exam Temperature 97.1 pulse 72 respirations 16 blood pressure 134/86 GENERAL APPEARANCE: Alert, non conversant. No acute distresslying comfortably her daughter is at bedside.  SKIN: No diaphoresis rash,  HEAD: Normocephalic, atraumatic  EYES: pt did not open eyes  EARS: External exam small amount of dried blood outsidee right ear-tympanic membrane  isn't visualized there appears to be some fresh blood pooled anterior to the tympanic membrane Left ear-appears to have some dried wax/blood?? by the tympanic membrane possibly a small amount of exudate somewhat difficult to visualize NOSE: No deformity or discharge.  MOUTH/THROAT: Lips w/o lesions.  RESPIRATORY: Breathing is even, unlabored. Lung sounds are clear--poor respiratory effort  CARDIOVASCULAR: Heart RRRan occasional irregular beat no murmurs, rubs or gallops. No peripheral edema.  GASTROINTESTINAL: Abdomen is soft, non-tender, not distended w/ normal bowel sounds GENITOURINARY: Bladder non tender, not distended  MUSCULOSKELETAL: appears to have some generalized stiffness--contractures which appear to be baseline NEUROLOGIC: Cranial nerves 2-12 grossly intact. PSYCHIATRIC: dementia, no behavioral issues  Patient Active Problem List   Diagnosis Date Noted  . Tinea corporis 06/08/2014  . Chronic respiratory failure with hypoxia 06/08/2014  . Dementia with behavioral disturbance   . Arthritis   . Gout   . Hypertension    labs --I did not note any previous ones available in the chart   Assessment and Plan  Bleeding from ear-this was discussed fairly extensively with her daughter at bedside as well as with nursing staff-as noted will try to get an ENT consult as soon as possible-and nursing staff ha tolf me that they actually have arranged 1 for this afternoon which is encouraging at this point continue to monitor  Hypertension Continue maxide and norvas-appears to be relatively stablec  Dementia with behavioral disturbance Continue ativan and lamictal--this appears  to be quite significant but stable  Gout Continue allopurinol--no recent flares to my knowledge   Chronic respiratory failure with hypoxia Continue duoneb and O2    Seizure disorder--this appears to be stable on Lamictal   GERD-she is on ranitidine apparently  this has been stable.  Respiratory issues this has not apparently been an issue recently -- do note she is on nebulizers as well as with folacin when necessary  Of note will order a CBC CMP and B12 level for updated values.    [id number] note greater than 40 minutes spent assessing patient-discussing her status with her daughter at bedside as well as with nursing staff-reviewing her medical records-and coordinating and formulating a plan of care for numerous diagnoses-of note greater than 50% of time spent coordinating plan of care                                                                                                                                                               Diabetic Foot Form - Detailed    No data filed

## 2014-08-10 ENCOUNTER — Encounter: Payer: Self-pay | Admitting: Internal Medicine

## 2014-09-24 ENCOUNTER — Non-Acute Institutional Stay (SKILLED_NURSING_FACILITY): Payer: Medicaid Other | Admitting: Internal Medicine

## 2014-09-24 DIAGNOSIS — F03918 Unspecified dementia, unspecified severity, with other behavioral disturbance: Secondary | ICD-10-CM

## 2014-09-24 DIAGNOSIS — Z515 Encounter for palliative care: Secondary | ICD-10-CM

## 2014-09-24 DIAGNOSIS — I10 Essential (primary) hypertension: Secondary | ICD-10-CM

## 2014-09-24 DIAGNOSIS — K5901 Slow transit constipation: Secondary | ICD-10-CM

## 2014-09-24 DIAGNOSIS — F0391 Unspecified dementia with behavioral disturbance: Secondary | ICD-10-CM

## 2014-09-24 DIAGNOSIS — M1000 Idiopathic gout, unspecified site: Secondary | ICD-10-CM

## 2014-09-24 NOTE — Progress Notes (Signed)
MRN: [id number] Name: Kaitlyn Curry  Sex: female Age: 80 y.o. DOB: [birth year]  [name] Level Of Care: SNF Provider: Merrilee SeashoreALEXANDER, Trease Bremner D Emergency Contacts: Extended Emergency Contact Information Primary Emergency Contact: [name],[name]  [place] Home Phone: [phone] Relation: Daughter  Code Status: DNR  Allergies: Ppd and Shellfish allergy  Chief Complaint  Patient presents with  . Medical Management of Chronic Issues    HPI: Patient is 80 y.o. female who is Hospice who is being seen for routine issues.  Past Medical History  Diagnosis Date  . Dementia without behavioral disturbance   . Arthritis   . Gout   . Dysphagia   . PVD (peripheral vascular disease)   . Hypertension   . Anemia   . Vitamin B12 deficiency   . Seizures     History reviewed. No pertinent past surgical history.    Medication List       This list is accurate as of: 09/24/14 11:59 PM.  Always use your most recent med list.               allopurinol 100 MG tablet  Commonly known as:  ZYLOPRIM  Take 100 mg by mouth daily.     lactulose 10 GM/15ML solution  Commonly known as:  CHRONULAC  Take 45 g by mouth 3 (three) times daily.     lamoTRIgine 100 MG tablet  Commonly known as:  LAMICTAL  Take 50 mg by mouth 2 (two) times daily.     triamterene-hydrochlorothiazide 37.5-25 MG per tablet  Commonly known as:  MAXZIDE-25  Take 1 tablet by mouth daily.        No orders of the defined types were placed in this encounter.    Immunization History  Administered Date(s) Administered  . Influenza-Unspecified 07/08/2014    History  Substance Use Topics  . Smoking status: Unknown If Ever Smoked  . Smokeless tobacco: Not on file  . Alcohol Use: Not on file    Review of Systems  DATA OBTAINED: from nurse GENERAL:  no fevers, fatigue, appetite changes SKIN: No itching, rash HEENT: No complaint RESPIRATORY: No cough, wheezing,  SOB CARDIAC: No chest pain, palpitations, lower extremity edema  GI: No abdominal pain, No N/V/D or constipation, No heartburn or reflux  GU: No dysuria, frequency or urgency, or incontinence  MUSCULOSKELETAL: No unrelieved bone/joint pain NEUROLOGIC: No headache, dizziness  PSYCHIATRIC: No overt anxiety or sadness  Filed Vitals:   09/24/14 1610  BP: 135/89  Pulse: 75  Temp: 97.3 F (36.3 C)  Resp: 22    Physical Exam  GENERAL APPEARANCE: Alert, nonconversant, No acute distress  SKIN: No diaphoresis rash HEENT: Unremarkable RESPIRATORY: Breathing is even, unlabored. Lung sounds are clear   CARDIOVASCULAR: Heart RRR no murmurs, rubs or gallops. No peripheral edema  GASTROINTESTINAL: Abdomen is soft, non-tender, not distended w/ normal bowel sounds.  GENITOURINARY: Bladder non tender, not distended  MUSCULOSKELETAL: No abnormal joints or musculature NEUROLOGIC: Cranial nerves 2-12 grossly intact PSYCHIATRIC: dementia, no behavioral issues  Patient Active Problem List   Diagnosis Date Noted  . Constipation 09/30/2014  . Hospice care patient 09/30/2014  . Ear bleeding 08/07/2014  . Fall at nursing home 06/22/2014  . Rib pain 06/22/2014  . Tinea corporis 06/08/2014  . Chronic respiratory failure with hypoxia 06/08/2014  . Dementia with behavioral disturbance   . Arthritis   . Gout   . Hypertension     CBC    Component Value Date/Time  WBC 5.9 06/14/2014 2341   RBC 4.38 06/14/2014 2341   HGB 10.8* 06/14/2014 2341   HCT 34.8* 06/14/2014 2341   PLT 134* 06/14/2014 2341   MCV 79.5 06/14/2014 2341   LYMPHSABS 0.9 06/14/2014 2341   MONOABS 0.4 06/14/2014 2341   EOSABS 0.0 06/14/2014 2341   BASOSABS 0.0 06/14/2014 2341    CMP     Component Value Date/Time   NA 141 06/14/2014 2341   K 4.2 06/14/2014 2341   CL 102 06/14/2014 2341   CO2 27 06/14/2014 2341   GLUCOSE 106* 06/14/2014 2341   BUN 22 06/14/2014 2341   CREATININE 1.41* 06/14/2014 2341   CALCIUM 9.0  06/14/2014 2341   PROT 7.0 06/14/2014 2341   ALBUMIN 3.2* 06/14/2014 2341   AST 15 06/14/2014 2341   ALT 6 06/14/2014 2341   ALKPHOS 62 06/14/2014 2341   BILITOT 0.2* 06/14/2014 2341   GFRNONAA 33* 06/14/2014 2341   GFRAA 38* 06/14/2014 2341    Assessment and Plan  Hypertension BP controlled on maxzide only  Dementia with behavioral disturbance Chronic and progressive and now on Hospice;onlamictal, only, now and has done well  Gout Pt continues on allopurinol as a comfort prevention of gout and has had no known flares  Constipation Large dose chronulac daily  Hospice care patient Pt with endstage dementia but fairly stable, medications have been trimmed to essential/comfort meds    Margit HanksALEXANDER, Kaiulani Sitton D, MD

## 2014-09-30 ENCOUNTER — Encounter: Payer: Self-pay | Admitting: Internal Medicine

## 2014-09-30 DIAGNOSIS — Z515 Encounter for palliative care: Secondary | ICD-10-CM | POA: Insufficient documentation

## 2014-09-30 DIAGNOSIS — K5900 Constipation, unspecified: Secondary | ICD-10-CM | POA: Insufficient documentation

## 2014-09-30 NOTE — Assessment & Plan Note (Signed)
Large dose chronulac daily

## 2014-09-30 NOTE — Assessment & Plan Note (Signed)
BP controlled on maxzide only

## 2014-09-30 NOTE — Assessment & Plan Note (Signed)
Pt continues on allopurinol as a comfort prevention of gout and has had no known flares

## 2014-09-30 NOTE — Assessment & Plan Note (Signed)
Chronic and progressive and now on Hospice;onlamictal, only, now and has done well

## 2014-09-30 NOTE — Assessment & Plan Note (Signed)
Pt with endstage dementia but fairly stable, medications have been trimmed to essential/comfort meds

## 2014-10-31 ENCOUNTER — Encounter: Payer: Self-pay | Admitting: Internal Medicine

## 2014-10-31 ENCOUNTER — Non-Acute Institutional Stay (SKILLED_NURSING_FACILITY): Payer: Medicaid Other | Admitting: Internal Medicine

## 2014-10-31 DIAGNOSIS — I10 Essential (primary) hypertension: Secondary | ICD-10-CM

## 2014-10-31 DIAGNOSIS — D509 Iron deficiency anemia, unspecified: Secondary | ICD-10-CM

## 2014-10-31 DIAGNOSIS — B353 Tinea pedis: Secondary | ICD-10-CM

## 2014-10-31 DIAGNOSIS — B352 Tinea manuum: Secondary | ICD-10-CM

## 2014-10-31 DIAGNOSIS — F03918 Unspecified dementia, unspecified severity, with other behavioral disturbance: Secondary | ICD-10-CM

## 2014-10-31 DIAGNOSIS — M1990 Unspecified osteoarthritis, unspecified site: Secondary | ICD-10-CM

## 2014-10-31 DIAGNOSIS — D519 Vitamin B12 deficiency anemia, unspecified: Secondary | ICD-10-CM

## 2014-10-31 DIAGNOSIS — F0391 Unspecified dementia with behavioral disturbance: Secondary | ICD-10-CM

## 2014-10-31 DIAGNOSIS — M1000 Idiopathic gout, unspecified site: Secondary | ICD-10-CM

## 2014-10-31 DIAGNOSIS — D518 Other vitamin B12 deficiency anemias: Secondary | ICD-10-CM

## 2014-10-31 DIAGNOSIS — J9611 Chronic respiratory failure with hypoxia: Secondary | ICD-10-CM

## 2014-10-31 DIAGNOSIS — B351 Tinea unguium: Secondary | ICD-10-CM

## 2014-10-31 NOTE — Assessment & Plan Note (Signed)
Pt on 2L O2 for sat < 90 and combivent prn

## 2014-10-31 NOTE — Assessment & Plan Note (Signed)
Continue with b12 supplement monthly

## 2014-10-31 NOTE — Progress Notes (Signed)
MRN: [id number] Name: Kaitlyn Curry  Sex: female Age: 81 y.o. DOB: 05-28-1928  [name] Facility/Room: 295D Level Of Care: SNF Provider: Merrilee SeashoreALEXANDER, Ajooni Karam D Emergency Contacts: Extended Emergency Contact Information Primary Emergency Contact: [name],[name]  [place] Home Phone: [phone] Relation: Daughter  Code Status: DNR  Allergies: Ppd and Shellfish allergy  Chief Complaint  Patient presents with  . Medical Management of Chronic Issues    HPI: Patient is 81 y.o. female who is hospice for endstage dementia who is being seen for routine.  Past Medical History  Diagnosis Date  . Dementia without behavioral disturbance   . Arthritis   . Gout   . Dysphagia   . PVD (peripheral vascular disease)   . Hypertension   . Anemia   . Vitamin B12 deficiency   . Seizures     History reviewed. No pertinent past surgical history.    Medication List       This list is accurate as of: 10/31/14  7:02 PM.  Always use your most recent med list.               allopurinol 100 MG tablet  Commonly known as:  ZYLOPRIM  Take 100 mg by mouth daily.     amLODipine 10 MG tablet  Commonly known as:  NORVASC  Take 10 mg by mouth daily.     atropine 1 % ophthalmic solution  Place 2 drops under the tongue every 4 (four) hours as needed.     calcium citrate-vitamin D 315-200 MG-UNIT per tablet  Commonly known as:  CITRACAL+D  Take 2 tablets by mouth 2 (two) times daily.     diclofenac sodium 1 % Gel  Commonly known as:  VOLTAREN  Apply topically 4 (four) times daily.     ipratropium 0.02 % nebulizer solution  Commonly known as:  ATROVENT  Take 0.5 mg by nebulization every 4 (four) hours as needed for wheezing or shortness of breath.     iron polysaccharides 150 MG capsule  Commonly known as:  NIFEREX  Take 150 mg by mouth every other day.     lactulose 10 GM/15ML solution  Commonly known as:  CHRONULAC  Take 45 g by mouth 2 (two) times daily.      lamoTRIgine 100 MG tablet  Commonly known as:  LAMICTAL  Take 50 mg by mouth 2 (two) times daily.     multivitamin capsule  Take 1 capsule by mouth daily.     triamterene-hydrochlorothiazide 37.5-25 MG per tablet  Commonly known as:  MAXZIDE-25  Take 1 tablet by mouth daily.     vitamin B-12 1000 MCG tablet  Commonly known as:  CYANOCOBALAMIN  Take 1,000 mcg by mouth every 30 (thirty) days.        Meds ordered this encounter  Medications  . atropine 1 % ophthalmic solution    Sig: Place 2 drops under the tongue every 4 (four) hours as needed.  Marland Kitchen. ipratropium (ATROVENT) 0.02 % nebulizer solution    Sig: Take 0.5 mg by nebulization every 4 (four) hours as needed for wheezing or shortness of breath.  Marland Kitchen. amLODipine (NORVASC) 10 MG tablet    Sig: Take 10 mg by mouth daily.  . vitamin B-12 (CYANOCOBALAMIN) 1000 MCG tablet    Sig: Take 1,000 mcg by mouth every 30 (thirty) days.  . iron polysaccharides (NIFEREX) 150 MG capsule    Sig: Take 150 mg by mouth every other day.  . Multiple Vitamin (MULTIVITAMIN)  capsule    Sig: Take 1 capsule by mouth daily.  . diclofenac sodium (VOLTAREN) 1 % GEL    Sig: Apply topically 4 (four) times daily.  . calcium citrate-vitamin D (CITRACAL+D) 315-200 MG-UNIT per tablet    Sig: Take 2 tablets by mouth 2 (two) times daily.    Immunization History  Administered Date(s) Administered  . Influenza-Unspecified 07/08/2014    History  Substance Use Topics  . Smoking status: Unknown If Ever Smoked  . Smokeless tobacco: Not on file  . Alcohol Use: Not on file    Review of Systems  UTO 2/2 dementia    Filed Vitals:   10/31/14 1446  BP: 135/89  Pulse: 75  Temp: 97.3 F (36.3 C)  Resp: 22    Physical Exam  GENERAL APPEARANCE: Alert,non conversant, No acute distress; BF in gerichair  SKIN: No diaphoresis rash HEENT: Unremarkable RESPIRATORY: Breathing is even, unlabored. Lung sounds are clear   CARDIOVASCULAR: Heart RRR no murmurs,  rubs or gallops. No peripheral edema  GASTROINTESTINAL: Abdomen is soft, non-tender, not distended w/ normal bowel sounds.  GENITOURINARY: Bladder non tender, not distended  MUSCULOSKELETAL: contractures; splints on both hands/wrists NEUROLOGIC: Cranial nerves 2-12 grossly intact PSYCHIATRIC: dementia, no behavioral issues  Patient Active Problem List   Diagnosis Date Noted  . Macrocytic anemia with vitamin B12 deficiency 10/31/2014  . Anemia, iron deficiency 10/31/2014  . Constipation 09/30/2014  . Hospice care patient 09/30/2014  . Ear bleeding 08/07/2014  . Fall at nursing home 06/22/2014  . Rib pain 06/22/2014  . Tinea manuum, pedis, and unguium 06/08/2014  . Chronic respiratory failure with hypoxia 06/08/2014  . Dementia with behavioral disturbance   . Arthritis   . Gout   . Hypertension     CBC    Component Value Date/Time   WBC 5.9 06/14/2014 2341   RBC 4.38 06/14/2014 2341   HGB 10.8* 06/14/2014 2341   HCT 34.8* 06/14/2014 2341   PLT 134* 06/14/2014 2341   MCV 79.5 06/14/2014 2341   LYMPHSABS 0.9 06/14/2014 2341   MONOABS 0.4 06/14/2014 2341   EOSABS 0.0 06/14/2014 2341   BASOSABS 0.0 06/14/2014 2341    CMP     Component Value Date/Time   NA 141 06/14/2014 2341   K 4.2 06/14/2014 2341   CL 102 06/14/2014 2341   CO2 27 06/14/2014 2341   GLUCOSE 106* 06/14/2014 2341   BUN 22 06/14/2014 2341   CREATININE 1.41* 06/14/2014 2341   CALCIUM 9.0 06/14/2014 2341   PROT 7.0 06/14/2014 2341   ALBUMIN 3.2* 06/14/2014 2341   AST 15 06/14/2014 2341   ALT 6 06/14/2014 2341   ALKPHOS 62 06/14/2014 2341   BILITOT 0.2* 06/14/2014 2341   GFRNONAA 33* 06/14/2014 2341   GFRAA 38* 06/14/2014 2341    Assessment and Plan  Chronic respiratory failure with hypoxia Pt on 2L O2 for sat < 90 and combivent prn   Tinea manuum, pedis, and unguium Penlac 8% to fingernails at night;Plan - continue   Hypertension Controlled on norvasc and maxide   Dementia with  behavioral disturbance Chronic, progressive, Hospice;pt on lamictal for behavoirs   Arthritis Continue allopurinol, no recent flares known and tylenol prn pain   Gout Continues with no flares;continue with allopurinol   Macrocytic anemia with vitamin B12 deficiency Continue with b12 supplement monthly   Anemia, iron deficiency MCV 05/2014 79.5;continue iron supplements    Margit Hanks, MD

## 2014-10-31 NOTE — Assessment & Plan Note (Signed)
Chronic, progressive, Hospice;pt on lamictal for behavoirs

## 2014-10-31 NOTE — Assessment & Plan Note (Signed)
Controlled on norvasc and maxide

## 2014-10-31 NOTE — Assessment & Plan Note (Signed)
Continues with no flares;continue with allopurinol

## 2014-10-31 NOTE — Assessment & Plan Note (Signed)
MCV 05/2014 79.5;continue iron supplements

## 2014-10-31 NOTE — Assessment & Plan Note (Signed)
Penlac 8% to fingernails at night;Plan - continue

## 2014-10-31 NOTE — Assessment & Plan Note (Signed)
Continue allopurinol, no recent flares known and tylenol prn pain

## 2014-12-10 ENCOUNTER — Non-Acute Institutional Stay (SKILLED_NURSING_FACILITY): Payer: Medicaid Other | Admitting: Internal Medicine

## 2014-12-10 DIAGNOSIS — L309 Dermatitis, unspecified: Secondary | ICD-10-CM

## 2014-12-10 LAB — CBC AND DIFFERENTIAL: WBC: 5.2 10^3/mL

## 2014-12-10 NOTE — Assessment & Plan Note (Signed)
I see very little but pt has emoillent cream on skin.Will treat with hydrocortisone 2.5 % sparingly to area BID for 2 weeks.

## 2014-12-10 NOTE — Progress Notes (Signed)
MRN: [id number] Name: Kaitlyn Curry  Sex: female Age: 81 y.o. DOB: 1928/02/01  [facility] Facility/Room:205D Level Of Care: SNF Provider: Merrilee Curry, Kaitlyn Curry Emergency Contacts: Extended Emergency Contact Information Primary Emergency Contact: Kaitlyn Curry,Kaitlyn Curry  [place] Home Phone: [phone] Relation: Daughter  Code Status: DNR/Hospice  Allergies: Ppd and Shellfish allergy  Chief Complaint  Patient presents with  . Acute Visit    HPI: Patient is 81 y.o. female who nursing asked me to see for daughter c/o eczema at hairline.  Past Medical History  Diagnosis Date  . Dementia without behavioral disturbance   . Arthritis   . Gout   . Dysphagia   . PVD (peripheral vascular disease)   . Hypertension   . Anemia   . Vitamin B12 deficiency   . Seizures     History reviewed. No pertinent past surgical history.    Medication List       This list is accurate as of: 12/10/14 11:59 PM.  Always use your most recent med list.               allopurinol 100 MG tablet  Commonly known as:  ZYLOPRIM  Take 100 mg by mouth daily.     amLODipine 10 MG tablet  Commonly known as:  NORVASC  Take 10 mg by mouth daily.     atropine 1 % ophthalmic solution  Place 2 drops under the tongue every 4 (four) hours as needed.     calcium citrate-vitamin Curry 315-200 MG-UNIT per tablet  Commonly known as:  CITRACAL+Curry  Take 2 tablets by mouth 2 (two) times daily.     diclofenac sodium 1 % Gel  Commonly known as:  VOLTAREN  Apply topically 4 (four) times daily.     ipratropium 0.02 % nebulizer solution  Commonly known as:  ATROVENT  Take 0.5 mg by nebulization every 4 (four) hours as needed for wheezing or shortness of breath.     iron polysaccharides 150 MG capsule  Commonly known as:  NIFEREX  Take 150 mg by mouth every other day.     lactulose 10 GM/15ML solution  Commonly known as:  CHRONULAC  Take 45 g by mouth 2 (two) times daily.     lamoTRIgine 100 MG  tablet  Commonly known as:  LAMICTAL  Take 50 mg by mouth 2 (two) times daily.     multivitamin capsule  Take 1 capsule by mouth daily.     triamterene-hydrochlorothiazide 37.5-25 MG per tablet  Commonly known as:  MAXZIDE-25  Take 1 tablet by mouth daily.     vitamin B-12 1000 MCG tablet  Commonly known as:  CYANOCOBALAMIN  Take 1,000 mcg by mouth every 30 (thirty) days.        No orders of the defined types were placed in this encounter.    Immunization History  Administered Date(s) Administered  . Influenza-Unspecified 07/08/2014    History  Substance Use Topics  . Smoking status: Unknown If Ever Smoked  . Smokeless tobacco: Not on file  . Alcohol Use: Not on file    Review of Systems  DATA OBTAINED: from nurse, family member GENERAL:  no fevers, fatigue, appetite changes SKIN: No itching, rash HEENT: No complaint RESPIRATORY: No cough, wheezing, SOB CARDIAC: No chest pain, palpitations, lower extremity edema  GI: No abdominal pain, No N/V/Curry or constipation, No heartburn or reflux  GU: No dysuria, frequency or urgency, or incontinence  MUSCULOSKELETAL: No unrelieved pain NEUROLOGIC: No new changes  PSYCHIATRIC: No overt anxiety or sadness  Filed Vitals:   12/10/14 1413  BP: 135/89  Pulse: 75  Temp: 97.3 F (36.3 C)  Resp: 22    Physical Exam  GENERAL APPEARANCE: Alert, BF No acute distress  SKIN: minimal scale, some hyperpigmentation, skin is covered with emollient HEENT: Unremarkable RESPIRATORY: Breathing is even, unlabored. Lung sounds are clear   CARDIOVASCULAR: Heart RRR no murmurs, rubs or gallops. No peripheral edema  GASTROINTESTINAL: Abdomen is soft, non-tender, not distended w/ normal bowel sounds.  GENITOURINARY: Bladder non tender, not distended  MUSCULOSKELETAL: No abnormal joints or musculature NEUROLOGIC: Cranial nerves 2-12 grossly intact. Moves all extremities PSYCHIATRIC: dementia no behavioral issues  Patient Active Problem  List   Diagnosis Date Noted  . Eczema 12/10/2014  . Macrocytic anemia with vitamin B12 deficiency 10/31/2014  . Anemia, iron deficiency 10/31/2014  . Constipation 09/30/2014  . Hospice care patient 09/30/2014  . Ear bleeding 08/07/2014  . Fall at nursing home 06/22/2014  . Rib pain 06/22/2014  . Tinea manuum, pedis, and unguium 06/08/2014  . Chronic respiratory failure with hypoxia 06/08/2014  . Dementia with behavioral disturbance   . Arthritis   . Gout   . Hypertension     CBC    Component Value Date/Time   WBC 5.9 06/14/2014 2341   RBC 4.38 06/14/2014 2341   HGB 10.8* 06/14/2014 2341   HCT 34.8* 06/14/2014 2341   PLT 134* 06/14/2014 2341   MCV 79.5 06/14/2014 2341   LYMPHSABS 0.9 06/14/2014 2341   MONOABS 0.4 06/14/2014 2341   EOSABS 0.0 06/14/2014 2341   BASOSABS 0.0 06/14/2014 2341    CMP     Component Value Date/Time   NA 141 06/14/2014 2341   K 4.2 06/14/2014 2341   CL 102 06/14/2014 2341   CO2 27 06/14/2014 2341   GLUCOSE 106* 06/14/2014 2341   BUN 22 06/14/2014 2341   CREATININE 1.41* 06/14/2014 2341   CALCIUM 9.0 06/14/2014 2341   PROT 7.0 06/14/2014 2341   ALBUMIN 3.2* 06/14/2014 2341   AST 15 06/14/2014 2341   ALT 6 06/14/2014 2341   ALKPHOS 62 06/14/2014 2341   BILITOT 0.2* 06/14/2014 2341   GFRNONAA 33* 06/14/2014 2341   GFRAA 38* 06/14/2014 2341    Assessment and Plan  Eczema I see very little but pt has emoillent cream on skin.Will treat with hydrocortisone 2.5 % sparingly to area BID for 2 weeks.     Margit Hanks, MD

## 2014-12-11 ENCOUNTER — Encounter: Payer: Self-pay | Admitting: Internal Medicine

## 2014-12-12 LAB — CBC AND DIFFERENTIAL
HEMATOCRIT: 35 % — AB (ref 36–46)
HEMOGLOBIN: 10.8 g/dL — AB (ref 12.0–16.0)
PLATELETS: 135 10*3/uL — AB (ref 150–399)

## 2015-01-22 ENCOUNTER — Encounter: Payer: Self-pay | Admitting: *Deleted

## 2015-01-23 ENCOUNTER — Encounter: Payer: Self-pay | Admitting: Internal Medicine

## 2015-01-23 ENCOUNTER — Non-Acute Institutional Stay (SKILLED_NURSING_FACILITY): Payer: Medicaid Other | Admitting: Internal Medicine

## 2015-01-23 DIAGNOSIS — Z710 Person encountering health services to consult on behalf of another person: Secondary | ICD-10-CM | POA: Diagnosis not present

## 2015-01-23 DIAGNOSIS — F0391 Unspecified dementia with behavioral disturbance: Secondary | ICD-10-CM

## 2015-01-23 DIAGNOSIS — F03918 Unspecified dementia, unspecified severity, with other behavioral disturbance: Secondary | ICD-10-CM

## 2015-01-23 NOTE — Assessment & Plan Note (Addendum)
Spoke for extended time with daughter; Hospice has d/c pt and she would really like them back. They do ROM of extremities daily which helps with contractures. Pt has had a change in status, she has been aspirating every meal with coughing and de-satting. Pt's daughter does not want to treat for PNA if/when it occurs. We reviewed list of pt's meds and are d/c-ing as many as possible and adding morphine for pain/anxiety. This represents a change in the daughters thinking.

## 2015-01-23 NOTE — Progress Notes (Signed)
MRN: [id number] Name: Kaitlyn Curry  Sex: female Age: 81 y.o. DOB: November 15, 1927  [name] Facility/Room:[facility]D Level Of Care: SNF Provider: Merrilee SeashoreALEXANDER, Vaishali Baise D Emergency Contacts: Extended Emergency Contact Information Primary Emergency Contact: [name],[name]  [place] Home Phone: [phone] Relation: Daughter  Code Status: DNR  Allergies: Ppd and Shellfish allergy  Chief Complaint  Patient presents with  . Acute Visit  . encounter for family conference without patient present    HPI: Patient is 81 y.o. female whose daughter is meeting with me to discuss pt's being dismissed from Hospice.  Past Medical History  Diagnosis Date  . Dementia without behavioral disturbance   . Arthritis   . Gout   . Dysphagia   . PVD (peripheral vascular disease)   . Hypertension   . Anemia   . Vitamin B12 deficiency   . Seizures     History reviewed. No pertinent past surgical history.    Medication List       This list is accurate as of: 01/23/15 11:59 PM.  Always use your most recent med list.               acetaminophen 325 MG tablet  Commonly known as:  TYLENOL  Take 650 mg by mouth every 6 (six) hours as needed for mild pain or moderate pain.     acetaminophen 650 MG suppository  Commonly known as:  TYLENOL  Place 650 mg rectally every 6 (six) hours as needed for mild pain or moderate pain.     allopurinol 100 MG tablet  Commonly known as:  ZYLOPRIM  Take 100 mg by mouth daily.     amLODipine 10 MG tablet  Commonly known as:  NORVASC  Take 10 mg by mouth daily.     atropine 1 % ophthalmic solution  Place 2 drops under the tongue every 4 (four) hours as needed.     calcium citrate-vitamin D 315-200 MG-UNIT per tablet  Commonly known as:  CITRACAL+D  Take 2 tablets by mouth 2 (two) times daily.     ciclopirox 8 % solution  Commonly known as:  PENLAC  Apply topically at bedtime. Apply over nail and surrounding skin. Apply daily over  previous coat. After seven (7) days, may remove with alcohol and continue cycle.     diclofenac sodium 1 % Gel  Commonly known as:  VOLTAREN  Apply topically 4 (four) times daily.     ipratropium 0.02 % nebulizer solution  Commonly known as:  ATROVENT  Take 0.5 mg by nebulization every 4 (four) hours as needed for wheezing or shortness of breath.     iron polysaccharides 150 MG capsule  Commonly known as:  NIFEREX  Take 150 mg by mouth every other day.     lactulose 10 GM/15ML solution  Commonly known as:  CHRONULAC  Take 45 g by mouth 2 (two) times daily.     lamoTRIgine 100 MG tablet  Commonly known as:  LAMICTAL  Take 50 mg by mouth 2 (two) times daily.     multivitamin capsule  Take 1 capsule by mouth daily.     ranitidine 150 MG tablet  Commonly known as:  ZANTAC  Take 150 mg by mouth daily. For reflux     triamterene-hydrochlorothiazide 37.5-25 MG per tablet  Commonly known as:  MAXZIDE-25  Take 1 tablet by mouth daily.        No orders of the defined types were placed in this encounter.  Immunization History  Administered Date(s) Administered  . Influenza-Unspecified 07/08/2014    History  Substance Use Topics  . Smoking status: Unknown If Ever Smoked  . Smokeless tobacco: Not on file  . Alcohol Use: Not on file    Review of Systems  DATA OBTAINED: from  nurse, medical record, daughter GENERAL:  no fevers, fatigue,gradually decreasing appetite SKIN: No itching, rash HEENT: No complaint RESPIRATORY: no wheezing, SOB CARDIAC: No chest pain, palpitations, lower extremity edema  GI: No abdominal pain, No N/V/D or constipation, coughing and desatting during and after eating for a week or so GU: No dysuria, frequency or urgency, or incontinence  MUSCULOSKELETAL: No unrelieved bone/joint pain NEUROLOGIC: No headache, dizziness  PSYCHIATRIC: No overt anxiety or sadness  Filed Vitals:   01/23/15 1518  BP: 135/89  Pulse: 62  Temp: 97.3 F (36.3 C)   Resp: 22    Physical Exam  GENERAL APPEARANCE: BF No acute distress  SKIN: No diaphoresis rash, or wounds HEENT: Unremarkable RESPIRATORY: Breathing is even, unlabored. Lung sounds are clear   CARDIOVASCULAR: Heart RRR no murmurs, rubs or gallops. No peripheral edema  GASTROINTESTINAL: Abdomen is soft, non-tender, not distended w/ normal bowel sounds.  GENITOURINARY: Bladder non tender, not distended  MUSCULOSKELETAL: contracturea NEUROLOGIC: Cranial nerves 2-12 grossly intact PSYCHIATRIC: dementia, no behavioral issues  Patient Active Problem List   Diagnosis Date Noted  . Encounter for family conference without patient present 01/23/2015  . Eczema 12/10/2014  . Macrocytic anemia with vitamin B12 deficiency 10/31/2014  . Anemia, iron deficiency 10/31/2014  . Constipation 09/30/2014  . Hospice care patient 09/30/2014  . Ear bleeding 08/07/2014  . Fall at nursing home 06/22/2014  . Rib pain 06/22/2014  . Tinea manuum, pedis, and unguium 06/08/2014  . Chronic respiratory failure with hypoxia 06/08/2014  . Dementia with behavioral disturbance   . Arthritis   . Gout   . Hypertension     CBC    Component Value Date/Time   WBC 5.2 12/10/2014   WBC 5.9 06/14/2014 2341   RBC 4.38 06/14/2014 2341   HGB 10.8* 12/12/2014   HCT 35* 12/12/2014   PLT 135* 12/12/2014   MCV 79.5 06/14/2014 2341   LYMPHSABS 0.9 06/14/2014 2341   MONOABS 0.4 06/14/2014 2341   EOSABS 0.0 06/14/2014 2341   BASOSABS 0.0 06/14/2014 2341    CMP     Component Value Date/Time   NA 141 06/14/2014 2341   K 4.2 06/14/2014 2341   CL 102 06/14/2014 2341   CO2 27 06/14/2014 2341   GLUCOSE 106* 06/14/2014 2341   BUN 22 06/14/2014 2341   CREATININE 1.41* 06/14/2014 2341   CALCIUM 9.0 06/14/2014 2341   PROT 7.0 06/14/2014 2341   ALBUMIN 3.2* 06/14/2014 2341   AST 15 06/14/2014 2341   ALT 6 06/14/2014 2341   ALKPHOS 62 06/14/2014 2341   BILITOT 0.2* 06/14/2014 2341   GFRNONAA 33* 06/14/2014 2341    GFRAA 38* 06/14/2014 2341    Assessment and Plan  Encounter for family conference without patient present Spoke for extended time with daughter; Hospice has d/c pt and she would really like them back. Pt has had a change in status, she has been aspirating every meal with coughing and de-satting. Pt's daughter does not want to treat for PNA if/when it occurs. We reviewed list of pt's meds and are d/c-ing as many as possible and adding morphine for pain/anxiety.    Time spent with chart,pt and family 35 min Margit Hanks, MD

## 2015-01-25 ENCOUNTER — Encounter: Payer: Self-pay | Admitting: Internal Medicine

## 2015-01-25 NOTE — Assessment & Plan Note (Signed)
Progressing with new sx of  Coughing and desatting during eating for past week or so.

## 2015-03-04 ENCOUNTER — Non-Acute Institutional Stay (SKILLED_NURSING_FACILITY): Payer: Medicaid Other | Admitting: Internal Medicine

## 2015-03-04 DIAGNOSIS — R1310 Dysphagia, unspecified: Secondary | ICD-10-CM | POA: Diagnosis not present

## 2015-03-04 DIAGNOSIS — F0391 Unspecified dementia with behavioral disturbance: Secondary | ICD-10-CM

## 2015-03-04 DIAGNOSIS — F03918 Unspecified dementia, unspecified severity, with other behavioral disturbance: Secondary | ICD-10-CM

## 2015-03-04 NOTE — Progress Notes (Signed)
MRN: [id number] Name: Kaitlyn Curry  Sex: female Age: 81 y.o. DOB: 01-30-1928  [facility] Facility/Room:205 Level Of Care: SNF Provider: Merrilee Seashore D Emergency Contacts: Extended Emergency Contact Information Primary Emergency Contact: [name] Home Phone: [phone] Relation: Daughter  Code Status:DNR   Allergies: Ppd and Shellfish allergy  Chief Complaint  Patient presents with  . Medical Management of Chronic Issues    HPI: Patient is 81 y.o. female who is a Hospice care pt for dementia and dysphagia being seen for routine issues.  Past Medical History  Diagnosis Date  . Dementia without behavioral disturbance   . Arthritis   . Gout   . Dysphagia   . PVD (peripheral vascular disease)   . Hypertension   . Anemia   . Vitamin B12 deficiency   . Seizures     History reviewed. No pertinent past surgical history.    Medication List       This list is accurate as of: 03/04/15 11:59 PM.  Always use your most recent med list.               acetaminophen 650 MG suppository  Commonly known as:  TYLENOL  Place 650 mg rectally every 6 (six) hours as needed for mild pain or moderate pain.     atropine 1 % ophthalmic solution  Place 2 drops under the tongue every 4 (four) hours as needed.     ipratropium-albuterol 0.5-2.5 (3) MG/3ML Soln  Commonly known as:  DUONEB  Take 3 mLs by nebulization 3 (three) times daily.     lactulose 10 GM/15ML solution  Commonly known as:  CHRONULAC  Take 45 g by mouth 2 (two) times daily.     morphine 20 MG/ML concentrated solution  Commonly known as:  ROXANOL  Take 5 mg by mouth every 4 (four) hours as needed for severe pain.        Meds ordered this encounter  Medications  . ipratropium-albuterol (DUONEB) 0.5-2.5 (3) MG/3ML SOLN    Sig: Take 3 mLs by nebulization 3 (three) times daily.  Marland Kitchen morphine (ROXANOL) 20 MG/ML concentrated solution    Sig: Take 5 mg by mouth every 4  (four) hours as needed for severe pain.    Immunization History  Administered Date(s) Administered  . Influenza-Unspecified 07/08/2014    History  Substance Use Topics  . Smoking status: Unknown If Ever Smoked  . Smokeless tobacco: Not on file  . Alcohol Use: Not on file    Review of Systems  DATA OBTAINED: from nurse - no new problems but continuation of coughing and wheezing after eating   Filed Vitals:   03/04/15 1732  BP: 129/65  Pulse: 82  Temp: 96.9 F (36.1 C)  Resp: 19    Physical Exam  GENERAL APPEARANCE: eyes open,No acute distress  SKIN: No diaphoresis rash HEENT: Unremarkable RESPIRATORY: Breathing is even, unlabored. Lung sounds are clear but decreased   CARDIOVASCULAR: Heart RRR no murmurs, rubs or gallops. No peripheral edema  GASTROINTESTINAL: Abdomen is soft, non-tender, not distended w/ normal bowel sounds.  GENITOURINARY: Bladder non tender, not distended  MUSCULOSKELETAL: braces on hand contractures NEUROLOGIC: pt did not move PSYCHIATRIC: n/a  Patient Active Problem List   Diagnosis Date Noted  . Dysphagia 03/07/2015  . Encounter for family conference without patient present 01/23/2015  . Eczema 12/10/2014  . Macrocytic anemia with vitamin B12 deficiency 10/31/2014  . Anemia, iron deficiency 10/31/2014  . Constipation 09/30/2014  .  Hospice care patient 09/30/2014  . Ear bleeding 08/07/2014  . Fall at nursing home 06/22/2014  . Rib pain 06/22/2014  . Tinea manuum, pedis, and unguium 06/08/2014  . Chronic respiratory failure with hypoxia 06/08/2014  . Dementia with behavioral disturbance   . Arthritis   . Gout   . Hypertension     CBC    Component Value Date/Time   WBC 5.2 12/10/2014   WBC 5.9 06/14/2014 2341   RBC 4.38 06/14/2014 2341   HGB 10.8* 12/12/2014   HCT 35* 12/12/2014   PLT 135* 12/12/2014   MCV 79.5 06/14/2014 2341   LYMPHSABS 0.9 06/14/2014 2341   MONOABS 0.4 06/14/2014 2341   EOSABS 0.0 06/14/2014 2341    BASOSABS 0.0 06/14/2014 2341    CMP     Component Value Date/Time   NA 141 06/14/2014 2341   K 4.2 06/14/2014 2341   CL 102 06/14/2014 2341   CO2 27 06/14/2014 2341   GLUCOSE 106* 06/14/2014 2341   BUN 22 06/14/2014 2341   CREATININE 1.41* 06/14/2014 2341   CALCIUM 9.0 06/14/2014 2341   PROT 7.0 06/14/2014 2341   ALBUMIN 3.2* 06/14/2014 2341   AST 15 06/14/2014 2341   ALT 6 06/14/2014 2341   ALKPHOS 62 06/14/2014 2341   BILITOT 0.2* 06/14/2014 2341   GFRNONAA 33* 06/14/2014 2341   GFRAA 38* 06/14/2014 2341    Assessment and Plan  Dysphagia Pt continues to cough and wheeze after eating; no aspiration PNA to date; plan for comfort care continues  Dementia with behavioral disturbance Progressing with contiued dysphaia and coughing with meals    Margit HanksALEXANDER, Shawon Denzer D, MD    [place]

## 2015-03-07 ENCOUNTER — Encounter: Payer: Self-pay | Admitting: Internal Medicine

## 2015-03-07 DIAGNOSIS — R1310 Dysphagia, unspecified: Secondary | ICD-10-CM | POA: Insufficient documentation

## 2015-03-07 NOTE — Assessment & Plan Note (Signed)
Pt continues to cough and wheeze after eating; no aspiration PNA to date; plan for comfort care continues

## 2015-03-07 NOTE — Assessment & Plan Note (Signed)
Progressing with contiued dysphaia and coughing with meals

## 2015-04-22 ENCOUNTER — Non-Acute Institutional Stay (SKILLED_NURSING_FACILITY): Payer: Medicaid Other | Admitting: Internal Medicine

## 2015-04-22 ENCOUNTER — Encounter: Payer: Self-pay | Admitting: Internal Medicine

## 2015-04-22 DIAGNOSIS — Z8669 Personal history of other diseases of the nervous system and sense organs: Secondary | ICD-10-CM | POA: Diagnosis not present

## 2015-04-22 DIAGNOSIS — M1000 Idiopathic gout, unspecified site: Secondary | ICD-10-CM

## 2015-04-22 DIAGNOSIS — I10 Essential (primary) hypertension: Secondary | ICD-10-CM | POA: Diagnosis not present

## 2015-04-22 DIAGNOSIS — Z87898 Personal history of other specified conditions: Secondary | ICD-10-CM | POA: Insufficient documentation

## 2015-04-22 NOTE — Assessment & Plan Note (Signed)
Well controlled on Norvasc;Plan - cont norvasc 10 mg

## 2015-04-22 NOTE — Assessment & Plan Note (Signed)
No flares or problems reported; Plan cont allopurinol 100 mg daily

## 2015-04-22 NOTE — Assessment & Plan Note (Signed)
No reported seizures; Plan - continue lamictal 50 mg BID

## 2015-04-22 NOTE — Progress Notes (Signed)
MRN: [id number] Name: Kaitlyn Curry  Sex: female Age: 81 y.o. DOB: [birth year]  [facility] Facility/Room:205D Level Of Care: SNF Provider: Merrilee Seashore D Emergency Contacts: Extended Emergency Contact Information Primary Emergency Contact: [name] Home Phone: [phone] Relation: Daughter  Code Status: DNR  Allergies: Ppd and Shellfish allergy  Chief Complaint  Patient presents with  . Medical Management of Chronic Issues    HPI: Patient is 81 y.o. female who is a Hospice pt for dementia who is being seen for HTN, gout and seizures.  Past Medical History  Diagnosis Date  . Dementia without behavioral disturbance   . Arthritis   . Gout   . Dysphagia   . PVD (peripheral vascular disease)   . Hypertension   . Anemia   . Vitamin B12 deficiency   . Seizures     History reviewed. No pertinent past surgical history.    Medication List       This list is accurate as of: 04/22/15 12:56 PM.  Always use your most recent med list.               acetaminophen 650 MG suppository  Commonly known as:  TYLENOL  Place 650 mg rectally every 6 (six) hours as needed for mild pain or moderate pain.     allopurinol 100 MG tablet  Commonly known as:  ZYLOPRIM  Take 100 mg by mouth daily.     amLODipine 10 MG tablet  Commonly known as:  NORVASC  Take 10 mg by mouth daily.     atropine 1 % ophthalmic solution  Place 2 drops under the tongue every 4 (four) hours as needed.     ipratropium-albuterol 0.5-2.5 (3) MG/3ML Soln  Commonly known as:  DUONEB  Take 3 mLs by nebulization 3 (three) times daily.     lactulose 10 GM/15ML solution  Commonly known as:  CHRONULAC  Take 30 g by mouth.     lamoTRIgine 100 MG tablet  Commonly known as:  LAMICTAL  Take 50 mg by mouth 2 (two) times daily.     morphine 20 MG/ML concentrated solution  Commonly known as:  ROXANOL  Take 5 mg by mouth every 4 (four) hours as needed for severe pain.         Meds ordered this encounter  Medications  . lamoTRIgine (LAMICTAL) 100 MG tablet    Sig: Take 50 mg by mouth 2 (two) times daily.  Marland Kitchen allopurinol (ZYLOPRIM) 100 MG tablet    Sig: Take 100 mg by mouth daily.  Marland Kitchen amLODipine (NORVASC) 10 MG tablet    Sig: Take 10 mg by mouth daily.    Immunization History  Administered Date(s) Administered  . Influenza-Unspecified 07/08/2014    History  Substance Use Topics  . Smoking status: Unknown If Ever Smoked  . Smokeless tobacco: Not on file  . Alcohol Use: Not on file    Review of Systems  DATA OBTAINED: from nurse- voices no concerns  Filed Vitals:   04/22/15 1247  BP: 112/62  Pulse: 72  Temp: 98.1 F (36.7 C)  Resp: 16    Physical Exam  GENERAL APPEARANCE: eyes open non conversant, No acute distress  SKIN: No diaphoresis rash HEENT: Unremarkable RESPIRATORY: Breathing is even, unlabored. Lung sounds are clear   CARDIOVASCULAR: Heart RRR no murmurs, rubs or gallops. No peripheral edema  GASTROINTESTINAL: Abdomen is soft, non-tender, not distended w/ normal bowel sounds.  GENITOURINARY: Bladder non tender, not distended  MUSCULOSKELETAL: contractures NEUROLOGIC: Cranial nerves 2-12 grossly intact PSYCHIATRIC: dementia, no behavioral issues  Patient Active Problem List   Diagnosis Date Noted  . History of seizures 04/22/2015  . Dysphagia 03/07/2015  . Encounter for family conference without patient present 01/23/2015  . Eczema 12/10/2014  . Macrocytic anemia with vitamin B12 deficiency 10/31/2014  . Anemia, iron deficiency 10/31/2014  . Constipation 09/30/2014  . Hospice care patient 09/30/2014  . Ear bleeding 08/07/2014  . Fall at nursing home 06/22/2014  . Rib pain 06/22/2014  . Tinea manuum, pedis, and unguium 06/08/2014  . Chronic respiratory failure with hypoxia 06/08/2014  . Dementia with behavioral disturbance   . Arthritis   . Gout   . Hypertension     CBC    Component Value Date/Time   WBC  5.2 12/10/2014   WBC 5.9 06/14/2014 2341   RBC 4.38 06/14/2014 2341   HGB 10.8* 12/12/2014   HCT 35* 12/12/2014   PLT 135* 12/12/2014   MCV 79.5 06/14/2014 2341   LYMPHSABS 0.9 06/14/2014 2341   MONOABS 0.4 06/14/2014 2341   EOSABS 0.0 06/14/2014 2341   BASOSABS 0.0 06/14/2014 2341    CMP     Component Value Date/Time   NA 141 06/14/2014 2341   K 4.2 06/14/2014 2341   CL 102 06/14/2014 2341   CO2 27 06/14/2014 2341   GLUCOSE 106* 06/14/2014 2341   BUN 22 06/14/2014 2341   CREATININE 1.41* 06/14/2014 2341   CALCIUM 9.0 06/14/2014 2341   PROT 7.0 06/14/2014 2341   ALBUMIN 3.2* 06/14/2014 2341   AST 15 06/14/2014 2341   ALT 6 06/14/2014 2341   ALKPHOS 62 06/14/2014 2341   BILITOT 0.2* 06/14/2014 2341   GFRNONAA 33* 06/14/2014 2341   GFRAA 38* 06/14/2014 2341    Assessment and Plan  Hypertension Well controlled on Norvasc;Plan - cont norvasc 10 mg  Gout No flares or problems reported; Plan cont allopurinol 100 mg daily  History of seizures No reported seizures; Plan - continue lamictal 50 mg BID    Margit Hanks, MD

## 2015-05-27 ENCOUNTER — Non-Acute Institutional Stay (SKILLED_NURSING_FACILITY): Payer: Medicaid Other | Admitting: Internal Medicine

## 2015-05-27 DIAGNOSIS — M1990 Unspecified osteoarthritis, unspecified site: Secondary | ICD-10-CM

## 2015-05-27 DIAGNOSIS — J9611 Chronic respiratory failure with hypoxia: Secondary | ICD-10-CM | POA: Diagnosis not present

## 2015-05-27 DIAGNOSIS — K5901 Slow transit constipation: Secondary | ICD-10-CM

## 2015-05-29 ENCOUNTER — Encounter: Payer: Self-pay | Admitting: Internal Medicine

## 2015-05-29 NOTE — Assessment & Plan Note (Signed)
No problems reported; Plan - continue scheduled lactulose  And supp prn

## 2015-05-29 NOTE — Assessment & Plan Note (Signed)
Pt has been stable without exacerbation or need for nebs;still O2 occasionally; Plan - cont O2 prn and nebs prn

## 2015-05-29 NOTE — Assessment & Plan Note (Addendum)
Pt has pain medication and no pain reported; Plan - continue current pain regimen

## 2015-05-29 NOTE — Progress Notes (Signed)
MRN: [id number] Name: Kaitlyn Curry  Sex: female Age: 81 y.o. DOB: 1927-10-13  [facility]: [facility] Facility/Room:205 Level Of Care: SNF Provider: Merrilee Seashore D Emergency Contacts: Extended Emergency Contact Information Primary Emergency Contact: [name] [place] Home Phone: [phone] Relation: Daughter  Code Status:   Allergies: Ppd and Shellfish allergy  Chief Complaint  Patient presents with  . Medical Management of Chronic Issues    HPI: Patient is 81 y.o. female who is hospice pt being seen for routine problems of chronic respiratory failure, constipation and arthritis.  Past Medical History  Diagnosis Date  . Dementia without behavioral disturbance   . Arthritis   . Gout   . Dysphagia   . PVD (peripheral vascular disease)   . Hypertension   . Anemia   . Vitamin B12 deficiency   . Seizures     No past surgical history on file.    Medication List       This list is accurate as of: 05/27/15 11:59 PM.  Always use your most recent med list.               acetaminophen 650 MG suppository  Commonly known as:  TYLENOL  Place 650 mg rectally every 6 (six) hours as needed for mild pain or moderate pain.     allopurinol 100 MG tablet  Commonly known as:  ZYLOPRIM  Take 100 mg by mouth daily.     amLODipine 10 MG tablet  Commonly known as:  NORVASC  Take 10 mg by mouth daily.     atropine 1 % ophthalmic solution  Place 2 drops under the tongue every 4 (four) hours as needed.     ipratropium-albuterol 0.5-2.5 (3) MG/3ML Soln  Commonly known as:  DUONEB  Take 3 mLs by nebulization 3 (three) times daily.     lactulose 10 GM/15ML solution  Commonly known as:  CHRONULAC  Take 30 g by mouth.     lamoTRIgine 100 MG tablet  Commonly known as:  LAMICTAL  Take 50 mg by mouth 2 (two) times daily.     morphine 20 MG/ML concentrated solution  Commonly known as:  ROXANOL  Take 5 mg by mouth every 4 (four) hours as needed for severe  pain.        No orders of the defined types were placed in this encounter.    Immunization History  Administered Date(s) Administered  . Influenza-Unspecified 07/08/2014    Social History  Substance Use Topics  . Smoking status: Unknown If Ever Smoked  . Smokeless tobacco: Not on file  . Alcohol Use: Not on file    Review of Systems  DATA OBTAINED: from  nurse GENERAL:  no fevers, fatigue, appetite changes SKIN: No itching, rash HEENT: No complaint RESPIRATORY: No cough, wheezing, SOB CARDIAC: No chest pain, palpitations, lower extremity edema  GI: No abdominal pain, No N/V/D or constipation, No heartburn or reflux  GU: No dysuria, frequency or urgency, or incontinence  MUSCULOSKELETAL: No unrelieved bone/joint pain NEUROLOGIC: No headache, dizziness  PSYCHIATRIC: No overt anxiety or sadness  Filed Vitals:   05/29/15 1500  BP: 112/62  Pulse: 82  Temp: 98.1 F (36.7 C)  Resp: 19    Physical Exam  GENERAL APPEARANCE: Alert, non conversant, No acute distress  SKIN: No diaphoresis rash, HEENT: Unremarkable RESPIRATORY: Breathing is even, unlabored. Lung sounds are clear   CARDIOVASCULAR: Heart RRR no murmurs, rubs or gallops. No peripheral edema  GASTROINTESTINAL: Abdomen is soft, non-tender,  not distended w/ normal bowel sounds.  GENITOURINARY: Bladder non tender, not distended  MUSCULOSKELETAL: contractures NEUROLOGIC: Cranial nerves 2-12 grossly intact PSYCHIATRIC: dementia, no behavioral issues  Patient Active Problem List   Diagnosis Date Noted  . History of seizures 04/22/2015  . Dysphagia 03/07/2015  . Encounter for family conference without patient present 01/23/2015  . Eczema 12/10/2014  . Macrocytic anemia with vitamin B12 deficiency 10/31/2014  . Anemia, iron deficiency 10/31/2014  . Constipation 09/30/2014  . Hospice care patient 09/30/2014  . Ear bleeding 08/07/2014  . Fall at nursing home 06/22/2014  . Rib pain 06/22/2014  . Tinea manuum,  pedis, and unguium 06/08/2014  . Chronic respiratory failure with hypoxia 06/08/2014  . Dementia with behavioral disturbance   . Arthritis   . Gout   . Hypertension     CBC    Component Value Date/Time   WBC 5.2 12/10/2014   WBC 5.9 06/14/2014 2341   RBC 4.38 06/14/2014 2341   HGB 10.8* 12/12/2014   HCT 35* 12/12/2014   PLT 135* 12/12/2014   MCV 79.5 06/14/2014 2341   LYMPHSABS 0.9 06/14/2014 2341   MONOABS 0.4 06/14/2014 2341   EOSABS 0.0 06/14/2014 2341   BASOSABS 0.0 06/14/2014 2341    CMP     Component Value Date/Time   NA 141 06/14/2014 2341   K 4.2 06/14/2014 2341   CL 102 06/14/2014 2341   CO2 27 06/14/2014 2341   GLUCOSE 106* 06/14/2014 2341   BUN 22 06/14/2014 2341   CREATININE 1.41* 06/14/2014 2341   CALCIUM 9.0 06/14/2014 2341   PROT 7.0 06/14/2014 2341   ALBUMIN 3.2* 06/14/2014 2341   AST 15 06/14/2014 2341   ALT 6 06/14/2014 2341   ALKPHOS 62 06/14/2014 2341   BILITOT 0.2* 06/14/2014 2341   GFRNONAA 33* 06/14/2014 2341   GFRAA 38* 06/14/2014 2341    Assessment and Plan  Chronic respiratory failure with hypoxia Pt has been stable without exacerbation or need for nebs;still O2 occasionally; Plan - cont O2 prn and nebs prn  Arthritis Pt has pain medication and no pain reported; Plan - continue current pain regimen  Constipation No problems reported; Plan - continue scheduled lactulose  And supp prn    Margit Hanks, MD

## 2015-08-05 ENCOUNTER — Encounter: Payer: Self-pay | Admitting: Internal Medicine

## 2015-08-05 ENCOUNTER — Non-Acute Institutional Stay (SKILLED_NURSING_FACILITY): Payer: Medicaid Other | Admitting: Internal Medicine

## 2015-08-05 DIAGNOSIS — S30810S Abrasion of lower back and pelvis, sequela: Secondary | ICD-10-CM | POA: Diagnosis not present

## 2015-08-05 DIAGNOSIS — F0391 Unspecified dementia with behavioral disturbance: Secondary | ICD-10-CM

## 2015-08-05 DIAGNOSIS — F03918 Unspecified dementia, unspecified severity, with other behavioral disturbance: Secondary | ICD-10-CM

## 2015-08-05 DIAGNOSIS — S30810A Abrasion of lower back and pelvis, initial encounter: Secondary | ICD-10-CM | POA: Insufficient documentation

## 2015-08-05 DIAGNOSIS — Z8669 Personal history of other diseases of the nervous system and sense organs: Secondary | ICD-10-CM

## 2015-08-05 DIAGNOSIS — Z87898 Personal history of other specified conditions: Secondary | ICD-10-CM

## 2015-08-05 NOTE — Progress Notes (Signed)
MRN: [id number] Name: Kaitlyn Curry  Sex: female Age: 81 y.o. DOB: November 15, 1927  [facility]: [facility] Facility/Room: Level Of Care: SNF Provider: Merrilee Seashore D Emergency Contacts: Extended Emergency Contact Information Primary Emergency Contact: [name] [place] Home Phone: [phone] Relation: Daughter  Code Status:   Allergies: Ppd and Shellfish allergy  Chief Complaint  Patient presents with  . Medical Management of Chronic Issues    HPI: Patient is 81 y.o. female , Hospice pt , who is being seen for routine issues of dementia, shear injury to L buttocks and h/o seizures.  Past Medical History  Diagnosis Date  . Dementia without behavioral disturbance   . Arthritis   . Gout   . Dysphagia   . PVD (peripheral vascular disease) (HCC)   . Hypertension   . Anemia   . Vitamin B12 deficiency   . Seizures (HCC)     History reviewed. No pertinent past surgical history.    Medication List       This list is accurate as of: 08/05/15 11:59 PM.  Always use your most recent med list.               acetaminophen 650 MG suppository  Commonly known as:  TYLENOL  Place 650 mg rectally every 6 (six) hours as needed for mild pain or moderate pain.     allopurinol 100 MG tablet  Commonly known as:  ZYLOPRIM  Take 100 mg by mouth daily.     amLODipine 10 MG tablet  Commonly known as:  NORVASC  Take 10 mg by mouth daily.     atropine 1 % ophthalmic solution  Place 2 drops under the tongue every 4 (four) hours as needed.     ipratropium-albuterol 0.5-2.5 (3) MG/3ML Soln  Commonly known as:  DUONEB  Take 3 mLs by nebulization 3 (three) times daily.     lactulose 10 GM/15ML solution  Commonly known as:  CHRONULAC  Take 30 g by mouth.     lamoTRIgine 100 MG tablet  Commonly known as:  LAMICTAL  Take 50 mg by mouth 2 (two) times daily.     morphine 20 MG/ML concentrated solution  Commonly known as:  ROXANOL  Take 5 mg by mouth every 4  (four) hours as needed for severe pain.        No orders of the defined types were placed in this encounter.    Immunization History  Administered Date(s) Administered  . Influenza-Unspecified 07/08/2014    Social History  Substance Use Topics  . Smoking status: Unknown If Ever Smoked  . Smokeless tobacco: Not on file  . Alcohol Use: Not on file    Review of Systems UTO 2/2 dementia ; nursing without concerns    Filed Vitals:   08/05/15 1605  BP: 112/62  Pulse: 70  Temp: 97 F (36.1 C)  Resp: 19    Physical Exam  GENERAL APPEARANCE: Alert, nonconversant, No acute distress  SKIN: No diaphoresis rash; shear L buttock dressed HEENT: Unremarkable RESPIRATORY: Breathing is even, unlabored. Lung sounds are clear   CARDIOVASCULAR: Heart RRR no murmurs, rubs or gallops. No peripheral edema  GASTROINTESTINAL: Abdomen is soft, non-tender, not distended w/ normal bowel sounds.  GENITOURINARY: Bladder non tender, not distended  MUSCULOSKELETAL: wasting and contractures upper and lower ext NEUROLOGIC: Cranial nerves 2-12 grossly intact; paresis all extremitis PSYCHIATRIC: dementia, no behavioral issues  Patient Active Problem List   Diagnosis Date Noted  . Abrasion of buttock, left  08/05/2015  . History of seizures 04/22/2015  . Dysphagia 03/07/2015  . Encounter for family conference without patient present 01/23/2015  . Eczema 12/10/2014  . Macrocytic anemia with vitamin B12 deficiency 10/31/2014  . Anemia, iron deficiency 10/31/2014  . Constipation 09/30/2014  . Hospice care patient 09/30/2014  . Ear bleeding 08/07/2014  . Fall at nursing home 06/22/2014  . Rib pain 06/22/2014  . Tinea manuum, pedis, and unguium 06/08/2014  . Chronic respiratory failure with hypoxia (HCC) 06/08/2014  . Dementia with behavioral disturbance   . Arthritis   . Gout   . Hypertension     CBC    Component Value Date/Time   WBC 5.2 12/10/2014   WBC 5.9 06/14/2014 2341   RBC 4.38  06/14/2014 2341   HGB 10.8* 12/12/2014   HCT 35* 12/12/2014   PLT 135* 12/12/2014   MCV 79.5 06/14/2014 2341   LYMPHSABS 0.9 06/14/2014 2341   MONOABS 0.4 06/14/2014 2341   EOSABS 0.0 06/14/2014 2341   BASOSABS 0.0 06/14/2014 2341    CMP     Component Value Date/Time   NA 141 06/14/2014 2341   K 4.2 06/14/2014 2341   CL 102 06/14/2014 2341   CO2 27 06/14/2014 2341   GLUCOSE 106* 06/14/2014 2341   BUN 22 06/14/2014 2341   CREATININE 1.41* 06/14/2014 2341   CALCIUM 9.0 06/14/2014 2341   PROT 7.0 06/14/2014 2341   ALBUMIN 3.2* 06/14/2014 2341   AST 15 06/14/2014 2341   ALT 6 06/14/2014 2341   ALKPHOS 62 06/14/2014 2341   BILITOT 0.2* 06/14/2014 2341   GFRNONAA 33* 06/14/2014 2341   GFRAA 38* 06/14/2014 2341    Assessment and Plan  Dementia with behavioral disturbance Chronic and progressive and reason for Hospice status; Plan - cont monitor and supportive care  Abrasion of buttock, left Pt within the past month with shearing injury;plan - wound care per nursing was ordered and wound has healed  History of seizures Pt continues without seizure actvity;Plan - cont lamictal 50 mg BID    Margit HanksALEXANDER, Tiondra Fang D, MD

## 2015-08-05 NOTE — Assessment & Plan Note (Signed)
Pt continues without seizure actvity;Plan - cont lamictal 50 mg BID

## 2015-08-05 NOTE — Assessment & Plan Note (Addendum)
Pt within the past month with shearing injury;plan - wound care per nursing was ordered and wound has healed

## 2015-08-05 NOTE — Assessment & Plan Note (Signed)
Chronic and progressive and reason for Hospice status; Plan - cont monitor and supportive care

## 2015-09-02 ENCOUNTER — Encounter: Payer: Self-pay | Admitting: Internal Medicine

## 2015-09-02 ENCOUNTER — Non-Acute Institutional Stay (SKILLED_NURSING_FACILITY): Payer: Medicaid Other | Admitting: Internal Medicine

## 2015-09-02 DIAGNOSIS — R1310 Dysphagia, unspecified: Secondary | ICD-10-CM | POA: Diagnosis not present

## 2015-09-02 DIAGNOSIS — Z8669 Personal history of other diseases of the nervous system and sense organs: Secondary | ICD-10-CM | POA: Diagnosis not present

## 2015-09-02 DIAGNOSIS — Z87898 Personal history of other specified conditions: Secondary | ICD-10-CM

## 2015-09-02 DIAGNOSIS — I10 Essential (primary) hypertension: Secondary | ICD-10-CM

## 2015-09-02 NOTE — Progress Notes (Signed)
MRN: [id number] Name: Kaitlyn Curry  Sex: female Age: 81 y.o. DOB: 1928-08-15  [facility] Facility/Room: Level Of Care: SNF Provider: Merrilee Curry, Kaitlyn Curry Emergency Contacts: Extended Emergency Contact Information Primary Emergency Contact: Kaitlyn Curry,Kaitlyn Curry  [place] Home Phone: [phone] Relation: Daughter  Code Status:   Allergies: Ppd and Shellfish allergy  Chief Complaint  Patient presents with  . Medical Management of Chronic Issues    HPI: Patient is 81 y.o. female who is a Hospice pt who is being seen for routine issues of HTN, dysphagia and seizures.  Past Medical History  Diagnosis Date  . Dementia without behavioral disturbance   . Arthritis   . Gout   . Dysphagia   . PVD (peripheral vascular disease) (HCC)   . Hypertension   . Anemia   . Vitamin B12 deficiency   . Seizures (HCC)     No past surgical history on file.    Medication List       This list is accurate as of: 09/02/15  8:53 PM.  Always use your most recent med list.               acetaminophen 650 MG suppository  Commonly known as:  TYLENOL  Place 650 mg rectally every 6 (six) hours as needed for mild pain or moderate pain.     allopurinol 100 MG tablet  Commonly known as:  ZYLOPRIM  Take 100 mg by mouth daily.     amLODipine 10 MG tablet  Commonly known as:  NORVASC  Take 10 mg by mouth daily.     atropine 1 % ophthalmic solution  Place 2 drops under the tongue every 4 (four) hours as needed.     ipratropium-albuterol 0.5-2.5 (3) MG/3ML Soln  Commonly known as:  DUONEB  Take 3 mLs by nebulization 3 (three) times daily.     lactulose 10 GM/15ML solution  Commonly known as:  CHRONULAC  Take 30 g by mouth.     lamoTRIgine 100 MG tablet  Commonly known as:  LAMICTAL  Take 50 mg by mouth 2 (two) times daily.     morphine 20 MG/ML concentrated solution  Commonly known as:  ROXANOL  Take 5 mg by mouth every 4 (four) hours as needed for severe pain.         No orders of the defined types were placed in this encounter.    Immunization History  Administered Date(s) Administered  . Influenza-Unspecified 07/08/2014    Social History  Substance Use Topics  . Smoking status: Unknown If Ever Smoked  . Smokeless tobacco: Not on file  . Alcohol Use: Not on file    Review of Systems - UTO 2/2 dementia; nursing without concerns    Filed Vitals:   09/02/15 1321  BP: 129/70  Pulse: 65  Temp: 96.9 F (36.1 C)  Resp: 18    Physical Exam  GENERAL APPEARANCE: Alert, non conversant, No acute distress  SKIN: No diaphoresis rash HEENT: Unremarkable RESPIRATORY: Breathing is even, unlabored. Lung sounds are clear   CARDIOVASCULAR: Heart RRR no murmurs, rubs or gallops. No peripheral edema  GASTROINTESTINAL: Abdomen is soft, non-tender, not distended w/ normal bowel sounds.  GENITOURINARY: Bladder non tender, not distended  MUSCULOSKELETAL: wasting and contractures UE and LE NEUROLOGIC: Cranial nerves 2-12 grossly intact; paresis all extremities PSYCHIATRIC: dementia, no behavioral issues  Patient Active Problem List   Diagnosis Date Noted  . Abrasion of buttock, left 08/05/2015  . History of seizures 04/22/2015  .  Dysphagia 03/07/2015  . Encounter for family conference without patient present 01/23/2015  . Eczema 12/10/2014  . Macrocytic anemia with vitamin B12 deficiency 10/31/2014  . Anemia, iron deficiency 10/31/2014  . Constipation 09/30/2014  . Hospice care patient 09/30/2014  . Ear bleeding 08/07/2014  . Fall at nursing home 06/22/2014  . Rib pain 06/22/2014  . Tinea manuum, pedis, and unguium 06/08/2014  . Chronic respiratory failure with hypoxia (HCC) 06/08/2014  . Dementia with behavioral disturbance   . Arthritis   . Gout   . Hypertension     CBC    Component Value Date/Time   WBC 5.2 12/10/2014   WBC 5.9 06/14/2014 2341   RBC 4.38 06/14/2014 2341   HGB 10.8* 12/12/2014   HCT 35* 12/12/2014   PLT 135*  12/12/2014   MCV 79.5 06/14/2014 2341   LYMPHSABS 0.9 06/14/2014 2341   MONOABS 0.4 06/14/2014 2341   EOSABS 0.0 06/14/2014 2341   BASOSABS 0.0 06/14/2014 2341    CMP     Component Value Date/Time   NA 141 06/14/2014 2341   K 4.2 06/14/2014 2341   CL 102 06/14/2014 2341   CO2 27 06/14/2014 2341   GLUCOSE 106* 06/14/2014 2341   BUN 22 06/14/2014 2341   CREATININE 1.41* 06/14/2014 2341   CALCIUM 9.0 06/14/2014 2341   PROT 7.0 06/14/2014 2341   ALBUMIN 3.2* 06/14/2014 2341   AST 15 06/14/2014 2341   ALT 6 06/14/2014 2341   ALKPHOS 62 06/14/2014 2341   BILITOT 0.2* 06/14/2014 2341   GFRNONAA 33* 06/14/2014 2341   GFRAA 38* 06/14/2014 2341    Assessment and Plan  Hypertension Controlled on norvasc 10 mg daily ;plan cont norvasc  Dysphagia No aspiration or PNA to date; plan - cont monitor, cont comfort care.  History of seizures No seizures to date; plan - cont lamictal 50 mg BID    Kaitlyn Hanks, MD

## 2015-09-02 NOTE — Assessment & Plan Note (Signed)
Controlled on norvasc 10 mg daily ;plan cont norvasc

## 2015-09-02 NOTE — Assessment & Plan Note (Signed)
No seizures to date; plan - cont lamictal 50 mg BID

## 2015-09-02 NOTE — Assessment & Plan Note (Signed)
No aspiration or PNA to date; plan - cont monitor, cont comfort care.

## 2015-10-02 ENCOUNTER — Non-Acute Institutional Stay (SKILLED_NURSING_FACILITY): Payer: Medicaid Other | Admitting: Internal Medicine

## 2015-10-02 DIAGNOSIS — M1990 Unspecified osteoarthritis, unspecified site: Secondary | ICD-10-CM | POA: Diagnosis not present

## 2015-10-02 DIAGNOSIS — J9611 Chronic respiratory failure with hypoxia: Secondary | ICD-10-CM

## 2015-10-02 DIAGNOSIS — K5901 Slow transit constipation: Secondary | ICD-10-CM | POA: Diagnosis not present

## 2015-10-05 ENCOUNTER — Encounter: Payer: Self-pay | Admitting: Internal Medicine

## 2015-10-05 NOTE — Assessment & Plan Note (Signed)
No reported significant pain; pt has tylenol or morphine prn pain;plan - cont current regimen

## 2015-10-05 NOTE — Assessment & Plan Note (Signed)
No reported problems on scheduled chronulac;plan- cont current regimen

## 2015-10-05 NOTE — Progress Notes (Signed)
MRN: [id number] Name: Kaitlyn Curry  Sex: female Age: 82 y.o. DOB: [birth year]  [facility] Facility/Room:205 Level Of Care: SNF Provider: Merrilee Seashore D Emergency Contacts: Extended Emergency Contact Information Primary Emergency Contact: [name] [place] Home Phone: [phone] Relation: Daughter  Code Status:   Allergies: Ppd and Shellfish allergy  Chief Complaint  Patient presents with  . Medical Management of Chronic Issues    HPI: Patient is 82 y.o. female whoHospice pt , who is being seen for routine issues of chronic respiratory failure, constipation and arthritis.   Past Medical History  Diagnosis Date  . Dementia without behavioral disturbance   . Arthritis   . Gout   . Dysphagia   . PVD (peripheral vascular disease) (HCC)   . Hypertension   . Anemia   . Vitamin B12 deficiency   . Seizures (HCC)     No past surgical history on file.    Medication List       This list is accurate as of: 10/02/15 11:59 PM.  Always use your most recent med list.               acetaminophen 650 MG suppository  Commonly known as:  TYLENOL  Place 650 mg rectally every 6 (six) hours as needed for mild pain or moderate pain.     allopurinol 100 MG tablet  Commonly known as:  ZYLOPRIM  Take 100 mg by mouth daily.     amLODipine 10 MG tablet  Commonly known as:  NORVASC  Take 10 mg by mouth daily.     atropine 1 % ophthalmic solution  Place 2 drops under the tongue every 4 (four) hours as needed.     ipratropium-albuterol 0.5-2.5 (3) MG/3ML Soln  Commonly known as:  DUONEB  Take 3 mLs by nebulization 3 (three) times daily.     lactulose 10 GM/15ML solution  Commonly known as:  CHRONULAC  Take 30 g by mouth.     lamoTRIgine 100 MG tablet  Commonly known as:  LAMICTAL  Take 50 mg by mouth 2 (two) times daily.     morphine 20 MG/ML concentrated solution  Commonly known as:  ROXANOL  Take 5 mg by mouth every 4 (four) hours as  needed for severe pain.        No orders of the defined types were placed in this encounter.    Immunization History  Administered Date(s) Administered  . Influenza-Unspecified 07/08/2014    Social History  Substance Use Topics  . Smoking status: Unknown If Ever Smoked  . Smokeless tobacco: Not on file  . Alcohol Use: Not on file    Review of Systems  UTO 2/2 dementia; nursing without concerns    Filed Vitals:   10/05/15 1855  BP: 129/70  Pulse: 99  Temp: 98 F (36.7 C)  Resp: 18    Physical Exam  GENERAL APPEARANCE: Alert, non conversant, No acute distress  SKIN: No diaphoresis rash HEENT: Unremarkable RESPIRATORY: Breathing is even, unlabored. Lung sounds are clear   CARDIOVASCULAR: Heart RRR no murmurs, rubs or gallops. No peripheral edema  GASTROINTESTINAL: Abdomen is soft, non-tender, not distended w/ normal bowel sounds.  GENITOURINARY: Bladder non tender, not distended  MUSCULOSKELETAL: wasting and contractures extremities NEUROLOGIC: Cranial nerves 2-12 grossly intact;paresis all extremities PSYCHIATRIC: dementia, no behavioral issues  Patient Active Problem List   Diagnosis Date Noted  . Abrasion of buttock, left 08/05/2015  . History of seizures 04/22/2015  . Dysphagia 03/07/2015  .  Encounter for family conference without patient present 01/23/2015  . Eczema 12/10/2014  . Macrocytic anemia with vitamin B12 deficiency 10/31/2014  . Anemia, iron deficiency 10/31/2014  . Constipation 09/30/2014  . Hospice care patient 09/30/2014  . Ear bleeding 08/07/2014  . Fall at nursing home 06/22/2014  . Rib pain 06/22/2014  . Tinea manuum, pedis, and unguium 06/08/2014  . Chronic respiratory failure with hypoxia (HCC) 06/08/2014  . Dementia with behavioral disturbance   . Arthritis   . Gout   . Hypertension     CBC    Component Value Date/Time   WBC 5.2 12/10/2014   WBC 5.9 06/14/2014 2341   RBC 4.38 06/14/2014 2341   HGB 10.8* 12/12/2014   HCT  35* 12/12/2014   PLT 135* 12/12/2014   MCV 79.5 06/14/2014 2341   LYMPHSABS 0.9 06/14/2014 2341   MONOABS 0.4 06/14/2014 2341   EOSABS 0.0 06/14/2014 2341   BASOSABS 0.0 06/14/2014 2341    CMP     Component Value Date/Time   NA 141 06/14/2014 2341   K 4.2 06/14/2014 2341   CL 102 06/14/2014 2341   CO2 27 06/14/2014 2341   GLUCOSE 106* 06/14/2014 2341   BUN 22 06/14/2014 2341   CREATININE 1.41* 06/14/2014 2341   CALCIUM 9.0 06/14/2014 2341   PROT 7.0 06/14/2014 2341   ALBUMIN 3.2* 06/14/2014 2341   AST 15 06/14/2014 2341   ALT 6 06/14/2014 2341   ALKPHOS 62 06/14/2014 2341   BILITOT 0.2* 06/14/2014 2341   GFRNONAA 33* 06/14/2014 2341   GFRAA 38* 06/14/2014 2341    Assessment and Plan  Chronic respiratory failure with hypoxia Pt with no reported pulmonary problems or needs for nebs or O2;plan - cont prn bebs and o2  Constipation No reported problems on scheduled chronulac;plan- cont current regimen  Arthritis No reported significant pain; pt has tylenol or morphine prn pain;plan - cont current regimen    Margit HanksALEXANDER, ANNE D, MD

## 2015-10-05 NOTE — Assessment & Plan Note (Signed)
Pt with no reported pulmonary problems or needs for nebs or O2;plan - cont prn bebs and o2

## 2015-11-06 ENCOUNTER — Non-Acute Institutional Stay: Payer: Federal, State, Local not specified - PPO | Admitting: Internal Medicine

## 2015-11-06 ENCOUNTER — Encounter: Payer: Self-pay | Admitting: Internal Medicine

## 2015-11-06 DIAGNOSIS — M7989 Other specified soft tissue disorders: Secondary | ICD-10-CM

## 2015-11-06 NOTE — Progress Notes (Signed)
MRN: [id number] Name: Kaitlyn Curry  Sex: female Age: 82 y.o. DOB: 11-01-1927  [facility]: [facility] Facility/Room: Level Of Care: SNF Provider: Merrilee Seashore D Emergency Contacts: Extended Emergency Contact Information Primary Emergency Contact: [name] [place] Home Phone: [phone] Relation: Daughter  Code Status:   Allergies: Ppd and Shellfish allergy  Chief Complaint  Patient presents with  . Acute Visit    HPI: Patient is 82 y.o. female with dementia, semi vegetative who nursing has asked me to see for hand swelling for 2 days. No redness or heat and doesn't appear to be painful to pt. Elevation of arm has not seemed to help. Pt has had no fever or other constitutional sx.  Past Medical History  Diagnosis Date  . Dementia without behavioral disturbance   . Arthritis   . Gout   . Dysphagia   . PVD (peripheral vascular disease) (HCC)   . Hypertension   . Anemia   . Vitamin B12 deficiency   . Seizures (HCC)     History reviewed. No pertinent past surgical history.    Medication List       This list is accurate as of: 11/06/15 11:59 PM.  Always use your most recent med list.               acetaminophen 650 MG suppository  Commonly known as:  TYLENOL  Place 650 mg rectally every 6 (six) hours as needed for mild pain or moderate pain.     allopurinol 100 MG tablet  Commonly known as:  ZYLOPRIM  Take 100 mg by mouth daily.     amLODipine 10 MG tablet  Commonly known as:  NORVASC  Take 10 mg by mouth daily.     atropine 1 % ophthalmic solution  Place 2 drops under the tongue every 4 (four) hours as needed.     ipratropium-albuterol 0.5-2.5 (3) MG/3ML Soln  Commonly known as:  DUONEB  Take 3 mLs by nebulization 3 (three) times daily.     lactulose 10 GM/15ML solution  Commonly known as:  CHRONULAC  Take 30 g by mouth.     lamoTRIgine 100 MG tablet  Commonly known as:  LAMICTAL  Take 50 mg by mouth 2 (two) times daily.      morphine 20 MG/ML concentrated solution  Commonly known as:  ROXANOL  Take 5 mg by mouth every 4 (four) hours as needed for severe pain.        No orders of the defined types were placed in this encounter.    Immunization History  Administered Date(s) Administered  . Influenza-Unspecified 07/08/2014    Social History  Substance Use Topics  . Smoking status: Unknown If Ever Smoked  . Smokeless tobacco: Not on file  . Alcohol Use: Not on file    Review of Systems UTO 2/2 dementia; nursing with concerns as in HPI    Filed Vitals:   11/06/15 1502  BP: 129/70  Pulse: 73  Temp: 97.7 F (36.5 C)  Resp: 18    Physical Exam  GENERAL APPEARANCE: eyes open non conversant, No acute distress  SKIN: No diaphoresis rash HEENT: Unremarkable RESPIRATORY: Breathing is even, unlabored. Lung sounds are clear   CARDIOVASCULAR: Heart RRR no murmurs, rubs or gallops; LUE - not only is hand swollen but entire LUE  Is swollen compared to R; there is no redness , heat , coolness or blueness GASTROINTESTINAL: Abdomen is soft, non-tender, not distended w/ normal bowel sounds.  GENITOURINARY:  Bladder non tender, not distended  MUSCULOSKELETAL: wasting and contractures all extremities NEUROLOGIC: Cranial nerves 2-12 grossly intact. Moves upper ext very little  PSYCHIATRIC: n/a, no behavioral issues  Patient Active Problem List   Diagnosis Date Noted  . Abrasion of buttock, left 08/05/2015  . History of seizures 04/22/2015  . Dysphagia 03/07/2015  . Encounter for family conference without patient present 01/23/2015  . Eczema 12/10/2014  . Macrocytic anemia with vitamin B12 deficiency 10/31/2014  . Anemia, iron deficiency 10/31/2014  . Constipation 09/30/2014  . Hospice care patient 09/30/2014  . Ear bleeding 08/07/2014  . Fall at nursing home 06/22/2014  . Rib pain 06/22/2014  . Tinea manuum, pedis, and unguium 06/08/2014  . Chronic respiratory failure with hypoxia (HCC) 06/08/2014   . Dementia with behavioral disturbance   . Arthritis   . Gout   . Hypertension     CBC    Component Value Date/Time   WBC 5.2 12/10/2014   WBC 5.9 06/14/2014 2341   RBC 4.38 06/14/2014 2341   HGB 10.8* 12/12/2014   HCT 35* 12/12/2014   PLT 135* 12/12/2014   MCV 79.5 06/14/2014 2341   LYMPHSABS 0.9 06/14/2014 2341   MONOABS 0.4 06/14/2014 2341   EOSABS 0.0 06/14/2014 2341   BASOSABS 0.0 06/14/2014 2341    CMP     Component Value Date/Time   NA 141 06/14/2014 2341   K 4.2 06/14/2014 2341   CL 102 06/14/2014 2341   CO2 27 06/14/2014 2341   GLUCOSE 106* 06/14/2014 2341   BUN 22 06/14/2014 2341   CREATININE 1.41* 06/14/2014 2341   CALCIUM 9.0 06/14/2014 2341   PROT 7.0 06/14/2014 2341   ALBUMIN 3.2* 06/14/2014 2341   AST 15 06/14/2014 2341   ALT 6 06/14/2014 2341   ALKPHOS 62 06/14/2014 2341   BILITOT 0.2* 06/14/2014 2341   GFRNONAA 33* 06/14/2014 2341   GFRAA 38* 06/14/2014 2341    Assessment and Plan  L UPPER EXT SWELLING - LUE U/S to r/o DVT; will continue arm elevation  Margit Hanks, MD

## 2015-11-14 ENCOUNTER — Encounter: Payer: Self-pay | Admitting: Internal Medicine

## 2015-11-16 ENCOUNTER — Non-Acute Institutional Stay: Payer: Federal, State, Local not specified - PPO | Admitting: Internal Medicine

## 2015-11-16 ENCOUNTER — Encounter: Payer: Self-pay | Admitting: Internal Medicine

## 2015-11-16 DIAGNOSIS — Z87898 Personal history of other specified conditions: Secondary | ICD-10-CM

## 2015-11-16 DIAGNOSIS — I10 Essential (primary) hypertension: Secondary | ICD-10-CM

## 2015-11-16 DIAGNOSIS — J9611 Chronic respiratory failure with hypoxia: Secondary | ICD-10-CM

## 2015-11-16 DIAGNOSIS — F0390 Unspecified dementia without behavioral disturbance: Secondary | ICD-10-CM | POA: Diagnosis not present

## 2015-11-16 DIAGNOSIS — Z8669 Personal history of other diseases of the nervous system and sense organs: Secondary | ICD-10-CM | POA: Diagnosis not present

## 2015-11-16 NOTE — Progress Notes (Signed)
Patient ID: Kaitlyn Curry, female   DOB: 08/04/1928, 82 y.o.   MRN: [id number] MRN: [id number] Name: Kaitlyn Curry  Sex: female Age: 82 y.o. DOB: 1928-09-25  [facility] Facility/Room:205 Level Of Care: SNF Provider: Roena Curry Emergency Contacts: Extended Emergency Contact Information Primary Emergency Contact: Kaitlyn Curry [place] Home Phone: [phone] Relation: Daughter  Code Status:   Allergies: Ppd and Shellfish allergy  Chief Complaint  Patient presents with  . Medical Management of Chronic Issues    HPI: Patient is 82 y.o. female whoHospice pt , who is being seen for routine issues of chronic respiratory failure, arthritis. Seizure disorder-Alzheimer's dementia.  Most acute issue recently was some increase left arm edema-this was assessed by Dr. Lyn Curry and a venous Doppler was ordered which is come back negative-there have been orders to encourage elevation and apparently this has helped edema appears resolved-the venous Doppler was negative for any DVT  Patient continues to pay her stable with supportive care no other recent acute issues to my knowledge-she is up in her wheelchair appears to be comfortable and stable today.  She does have a history of hypertension she is on Norvasc blood pressures appear to be well controlled systolics mainly in the 130s the highest one I see is in the 150s but this does not appear to be common  Past Medical History  Diagnosis Date  . Dementia without behavioral disturbance   . Arthritis   . Gout   . Dysphagia   . PVD (peripheral vascular disease) (HCC)   . Hypertension   . Anemia   . Vitamin B12 deficiency   . Seizures (HCC)     No past surgical history on file.    Medication List       This list is accurate as of: 11/16/15  4:37 PM.  Always use your most recent med list.               acetaminophen 650 MG suppository  Commonly known as:  TYLENOL  Place 650 mg rectally every 6 (six)  hours as needed for mild pain or moderate pain.     allopurinol 100 MG tablet  Commonly known as:  ZYLOPRIM  Take 100 mg by mouth daily.     amLODipine 10 MG tablet  Commonly known as:  NORVASC  Take 10 mg by mouth daily.     atropine 1 % ophthalmic solution  Place 2 drops under the tongue every 4 (four) hours as needed.     ipratropium-albuterol 0.5-2.5 (3) MG/3ML Soln  Commonly known as:  DUONEB  Take 3 mLs by nebulization 3 (three) times daily.     lactulose 10 GM/15ML solution  Commonly known as:  CHRONULAC  Take 30 g by mouth.     lamoTRIgine 100 MG tablet  Commonly known as:  LAMICTAL  Take 50 mg by mouth 2 (two) times daily.     morphine 20 MG/ML concentrated solution  Commonly known as:  ROXANOL  Take 5 mg by mouth every 4 (four) hours as needed for severe pain.          Immunization History  Administered Date(s) Administered  . Influenza-Unspecified 07/08/2014    Social History  Substance Use Topics  . Smoking status: Unknown If Ever Smoked  . Smokeless tobacco: Not on file  . Alcohol Use: Not on file    Review of Systems  UTO 2/2 dementia; nursing without concerns    Filed Vitals:   11/16/15 1629  BP: 134/79  Pulse: 74  Temp: 97.1 F (36.2 C)  Resp: 18    Physical Exam  GENERAL APPEARANCE: Alert, non conversant, No acute distress  SKIN: No diaphoresis rash HEENT: Unremarkable RESPIRATORY: Breathing is even, unlabored. Lung sounds are clear  poor respiratory effort-patient does not follow commands CARDIOVASCULAR: Heart RRR no murmurs, rubs or gallops. No peripheral edema  GASTROINTESTINAL: Abdomen is soft, non-tender, not distended w/ normal bowel sounds.   MUSCULOSKELETAL: wasting and contractures extremities-radial pulses are intact bilaterally the edema appears to be fairly resolved on the left NEUROLOGIC: Cranial nerves 2-12 grossly intact;paresis all extremities PSYCHIATRIC: dementia, no behavioral issues  Patient Active Problem  List   Diagnosis Date Noted  . Abrasion of buttock, left 08/05/2015  . History of seizures 04/22/2015  . Dysphagia 03/07/2015  . Encounter for family conference without patient present 01/23/2015  . Eczema 12/10/2014  . Macrocytic anemia with vitamin B12 deficiency 10/31/2014  . Anemia, iron deficiency 10/31/2014  . Constipation 09/30/2014  . Hospice care patient 09/30/2014  . Ear bleeding 08/07/2014  . Fall at nursing home 06/22/2014  . Rib pain 06/22/2014  . Tinea manuum, pedis, and unguium 06/08/2014  . Chronic respiratory failure with hypoxia (HCC) 06/08/2014  . Dementia with behavioral disturbance   . Arthritis   . Gout   . Hypertension     CBC    Component Value Date/Time   WBC 5.2 12/10/2014   WBC 5.9 06/14/2014 2341   RBC 4.38 06/14/2014 2341   HGB 10.8* 12/12/2014   HCT 35* 12/12/2014   PLT 135* 12/12/2014   MCV 79.5 06/14/2014 2341   LYMPHSABS 0.9 06/14/2014 2341   MONOABS 0.4 06/14/2014 2341   EOSABS 0.0 06/14/2014 2341   BASOSABS 0.0 06/14/2014 2341    CMP     Component Value Date/Time   NA 141 06/14/2014 2341   K 4.2 06/14/2014 2341   CL 102 06/14/2014 2341   CO2 27 06/14/2014 2341   GLUCOSE 106* 06/14/2014 2341   BUN 22 06/14/2014 2341   CREATININE 1.41* 06/14/2014 2341   CALCIUM 9.0 06/14/2014 2341   PROT 7.0 06/14/2014 2341   ALBUMIN 3.2* 06/14/2014 2341   AST 15 06/14/2014 2341   ALT 6 06/14/2014 2341   ALKPHOS 62 06/14/2014 2341   BILITOT 0.2* 06/14/2014 2341   GFRNONAA 33* 06/14/2014 2341   GFRAA 38* 06/14/2014 2341    Assessment and Plan  History of chronic respiratory failure with hypoxia-no recent pulmonary issues-she does have when necessary nebs with oxygen.  History of osteoarthritis-this appears to be controlled currently she has Tylenol and morphine when necessary pain my knowledge this is not really big issue recently.  Hypertension this appears well controlled as noted above she is on Norvasc 10 mg a day.  History of  seizures-no recent seizures-she is on Lamictal 50 mg twice a day.  History of dysphasia this is been relatively stable with no recent aspiration or pneumonia-continue comfort care measures and monitoring.  Dementia-she continues with supportive care again she is under hospice care secondary to end-stage dementia-this appears relatively stable with the supportive care.  [id number]  LASSEN, ARLO C,

## 2016-01-12 ENCOUNTER — Non-Acute Institutional Stay (SKILLED_NURSING_FACILITY): Payer: Federal, State, Local not specified - PPO | Admitting: Internal Medicine

## 2016-01-12 DIAGNOSIS — H109 Unspecified conjunctivitis: Secondary | ICD-10-CM

## 2016-01-17 ENCOUNTER — Encounter: Payer: Self-pay | Admitting: Internal Medicine

## 2016-01-17 NOTE — Progress Notes (Signed)
MRN: [id number] Name: Kaitlyn Curry  Sex: female Age: 82 y.o. DOB: March 13, 1928  [facility] Facility/Room:205 Level Of Care: SNF Provider: Merrilee Seashore D Emergency Contacts: Extended Emergency Contact Information Primary Emergency Contact: [name] [place] Home Phone: [phone] Relation: Daughter  Code Status:   Allergies: Ppd and Shellfish allergy  Chief Complaint  Patient presents with  . Acute Visit    HPI: Patient is 82 y.o. female whose daughter stopped me in the hall to ask me to look at her mother's R eye. She has noted discharge from R eye just today and a swelling inside lower lid. Denies pt has had allergy sx , no coughs or colds or fever.  Past Medical History  Diagnosis Date  . Dementia without behavioral disturbance   . Arthritis   . Gout   . Dysphagia   . PVD (peripheral vascular disease) (HCC)   . Hypertension   . Anemia   . Vitamin B12 deficiency   . Seizures (HCC)     No past surgical history on file.    Medication List       This list is accurate as of: 01/12/16 11:59 PM.  Always use your most recent med list.               acetaminophen 650 MG suppository  Commonly known as:  TYLENOL  Place 650 mg rectally every 6 (six) hours as needed for mild pain or moderate pain.     allopurinol 100 MG tablet  Commonly known as:  ZYLOPRIM  Take 100 mg by mouth daily.     amLODipine 10 MG tablet  Commonly known as:  NORVASC  Take 10 mg by mouth daily.     atropine 1 % ophthalmic solution  Place 2 drops under the tongue every 4 (four) hours as needed.     ipratropium-albuterol 0.5-2.5 (3) MG/3ML Soln  Commonly known as:  DUONEB  Take 3 mLs by nebulization 3 (three) times daily.     lactulose 10 GM/15ML solution  Commonly known as:  CHRONULAC  Take 30 g by mouth.     lamoTRIgine 100 MG tablet  Commonly known as:  LAMICTAL  Take 50 mg by mouth 2 (two) times daily.     morphine 20 MG/ML concentrated  solution  Commonly known as:  ROXANOL  Take 5 mg by mouth every 4 (four) hours as needed for severe pain.        No orders of the defined types were placed in this encounter.    Immunization History  Administered Date(s) Administered  . Influenza-Unspecified 07/08/2014    Social History  Substance Use Topics  . Smoking status: Unknown If Ever Smoked  . Smokeless tobacco: Not on file  . Alcohol Use: Not on file    Review of Systems UTO from pt 2/2 dementia; per daughter as in HPI    34 Vitals:   01/17/16 0016  BP: 129/70  Pulse: 79  Temp: 97.1 F (36.2 C)  Resp: 16    Physical Exam  GENERAL APPEARANCE: eyes open, conversant, No acute distress  SKIN: No diaphoresis rash HEENT: conjunctiva with mild injection and mod cloudy d/c; inside lower lid area of very slt swelling with no redness, very soft and non specific RESPIRATORY: Breathing is even, unlabored. Lung sounds are clear   CARDIOVASCULAR: Heart RRR no murmurs, rubs or gallops. No peripheral edema  GASTROINTESTINAL: Abdomen is soft, non-tender, not distended w/ normal bowel sounds.  GENITOURINARY: Bladder  non tender, not distended  MUSCULOSKELETAL: contractures and wasting all ext NEUROLOGIC: Cranial nerves 2-12 grossly intact;not vegetative but minimaly responsive PSYCHIATRIC: n/a  Patient Active Problem List   Diagnosis Date Noted  . Abrasion of buttock, left 08/05/2015  . History of seizures 04/22/2015  . Dysphagia 03/07/2015  . Encounter for family conference without patient present 01/23/2015  . Eczema 12/10/2014  . Macrocytic anemia with vitamin B12 deficiency 10/31/2014  . Anemia, iron deficiency 10/31/2014  . Constipation 09/30/2014  . Hospice care patient 09/30/2014  . Ear bleeding 08/07/2014  . Fall at nursing home 06/22/2014  . Rib pain 06/22/2014  . Tinea manuum, pedis, and unguium 06/08/2014  . Chronic respiratory failure with hypoxia (HCC) 06/08/2014  . Dementia with behavioral  disturbance   . Arthritis   . Gout   . Hypertension     CBC    Component Value Date/Time   WBC 5.2 12/10/2014   WBC 5.9 06/14/2014 2341   RBC 4.38 06/14/2014 2341   HGB 10.8* 12/12/2014   HCT 35* 12/12/2014   PLT 135* 12/12/2014   MCV 79.5 06/14/2014 2341   LYMPHSABS 0.9 06/14/2014 2341   MONOABS 0.4 06/14/2014 2341   EOSABS 0.0 06/14/2014 2341   BASOSABS 0.0 06/14/2014 2341    CMP     Component Value Date/Time   NA 141 06/14/2014 2341   K 4.2 06/14/2014 2341   CL 102 06/14/2014 2341   CO2 27 06/14/2014 2341   GLUCOSE 106* 06/14/2014 2341   BUN 22 06/14/2014 2341   CREATININE 1.41* 06/14/2014 2341   CALCIUM 9.0 06/14/2014 2341   PROT 7.0 06/14/2014 2341   ALBUMIN 3.2* 06/14/2014 2341   AST 15 06/14/2014 2341   ALT 6 06/14/2014 2341   ALKPHOS 62 06/14/2014 2341   BILITOT 0.2* 06/14/2014 2341   GFRNONAA 33* 06/14/2014 2341   GFRAA 38* 06/14/2014 2341    Assessment and Plan  CONJUNCTIVITIS  R EYE - garamycin drops QID R eye for  10 days  Margit HanksALEXANDER, ANNE D, MD

## 2016-02-16 ENCOUNTER — Encounter: Payer: Self-pay | Admitting: Internal Medicine

## 2016-02-16 ENCOUNTER — Non-Acute Institutional Stay (SKILLED_NURSING_FACILITY): Payer: Federal, State, Local not specified - PPO | Admitting: Internal Medicine

## 2016-02-16 DIAGNOSIS — I10 Essential (primary) hypertension: Secondary | ICD-10-CM

## 2016-02-16 DIAGNOSIS — Z8669 Personal history of other diseases of the nervous system and sense organs: Secondary | ICD-10-CM

## 2016-02-16 DIAGNOSIS — F0391 Unspecified dementia with behavioral disturbance: Secondary | ICD-10-CM

## 2016-02-16 DIAGNOSIS — Z87898 Personal history of other specified conditions: Secondary | ICD-10-CM

## 2016-02-16 DIAGNOSIS — F03918 Unspecified dementia, unspecified severity, with other behavioral disturbance: Secondary | ICD-10-CM

## 2016-02-16 NOTE — Progress Notes (Signed)
MRN: [id number] Name: Kaitlyn Curry  Sex: female Age: 82 y.o. DOB: [birth year]  [name] Facility/Room:205 D Level Of Care: SNF [facility] Contacts: Extended Emergency Contact Information Primary Emergency Contact: [name],[name]  [place] Home Phone: [phone] Relation: Daughter  Code Status: DNR  Allergies: Ppd and Shellfish allergy  Chief Complaint  Patient presents with  . Acute Visit    Acute    HPI: Patient is 82 y.o. female who is a Hospice pt being seen for routine issues of HTN, dementia and h/o seizures.  Past Medical History  Diagnosis Date  . Dementia without behavioral disturbance   . Arthritis   . Gout   . Dysphagia   . PVD (peripheral vascular disease) (HCC)   . Hypertension   . Anemia   . Vitamin B12 deficiency   . Seizures (HCC)     History reviewed. No pertinent past surgical history.    Medication List       This list is accurate as of: 02/16/16 11:59 PM.  Always use your most recent med list.               acetaminophen 650 MG suppository  Commonly known as:  TYLENOL  Place 650 mg rectally every 6 (six) hours as needed for mild pain or moderate pain. Not to exceed 3000 mg in 24 hours.     allopurinol 100 MG tablet  Commonly known as:  ZYLOPRIM  Take 100 mg by mouth daily.     amLODipine 10 MG tablet  Commonly known as:  NORVASC  Take 10 mg by mouth daily.     atropine 1 % ophthalmic solution  Place 2 drops under the tongue every 4 (four) hours as needed.     bisacodyl 10 MG suppository  Commonly known as:  DULCOLAX  Place 10 mg rectally as needed for moderate constipation.     ipratropium-albuterol 0.5-2.5 (3) MG/3ML Soln  Commonly known as:  DUONEB  Take 3 mLs by nebulization 3 (three) times daily.     ipratropium-albuterol 0.5-2.5 (3) MG/3ML Soln  Commonly known as:  DUONEB  Take 3 mLs by nebulization every 4 (four) hours as needed.     lactulose 10 GM/15ML solution   Commonly known as:  CHRONULAC  Give 45 ml by mouth once daily. Make lactulose to pudding thickened before administered.     lamoTRIgine 100 MG tablet  Commonly known as:  LAMICTAL  Take 50 mg by mouth every 12 (twelve) hours.     morphine 20 MG/ML concentrated solution  Commonly known as:  ROXANOL  Take 5 mg by mouth every 4 (four) hours as needed for severe pain.        Meds ordered this encounter  Medications  . bisacodyl (DULCOLAX) 10 MG suppository    Sig: Place 10 mg rectally as needed for moderate constipation.  Marland Kitchen. ipratropium-albuterol (DUONEB) 0.5-2.5 (3) MG/3ML SOLN    Sig: Take 3 mLs by nebulization every 4 (four) hours as needed.    Immunization History  Administered Date(s) Administered  . Influenza-Unspecified 07/08/2014, 06/22/2015    Social History  Substance Use Topics  . Smoking status: Unknown If Ever Smoked  . Smokeless tobacco: Not on file  . Alcohol Use: Not on file    Review of Systems  UTO 2/2 dementia    Filed Vitals:   02/16/16 1151  BP: 129/70  Pulse: 82  Temp: 96.9 F (36.1 C)  Resp: 16    Physical  Exam  GENERAL APPEARANCE: eyes open ; non conversant, No acute distress  SKIN: No diaphoresis  HEENT: Unremarkable RESPIRATORY: Breathing is even, unlabored. Lung sounds are clear   CARDIOVASCULAR: Heart RRR no murmurs, rubs or gallops. No peripheral edema  GASTROINTESTINAL: Abdomen is soft, non-tender, not distended w/ normal bowel sounds.  GENITOURINARY: Bladder non tender, not distended  MUSCULOSKELETAL:wasting and contractures all ext  NEUROLOGIC: Cranial nerves 2-12 grossly intact. Moves upper extremities very little PSYCHIATRIC: n/a, no behavoirs  Patient Active Problem List   Diagnosis Date Noted  . Abrasion of buttock, left 08/05/2015  . History of seizures 04/22/2015  . Dysphagia 03/07/2015  . Encounter for family conference without patient present 01/23/2015  . Eczema 12/10/2014  . Macrocytic anemia with vitamin B12  deficiency 10/31/2014  . Anemia, iron deficiency 10/31/2014  . Constipation 09/30/2014  . Hospice care patient 09/30/2014  . Ear bleeding 08/07/2014  . Fall at nursing home 06/22/2014  . Rib pain 06/22/2014  . Tinea manuum, pedis, and unguium 06/08/2014  . Chronic respiratory failure with hypoxia (HCC) 06/08/2014  . Dementia with behavioral disturbance   . Arthritis   . Gout   . Hypertension     CBC    Component Value Date/Time   WBC 5.2 12/10/2014   WBC 5.9 06/14/2014 2341   RBC 4.38 06/14/2014 2341   HGB 10.8* 12/12/2014   HCT 35* 12/12/2014   PLT 135* 12/12/2014   MCV 79.5 06/14/2014 2341   LYMPHSABS 0.9 06/14/2014 2341   MONOABS 0.4 06/14/2014 2341   EOSABS 0.0 06/14/2014 2341   BASOSABS 0.0 06/14/2014 2341    CMP     Component Value Date/Time   NA 141 06/14/2014 2341   K 4.2 06/14/2014 2341   CL 102 06/14/2014 2341   CO2 27 06/14/2014 2341   GLUCOSE 106* 06/14/2014 2341   BUN 22 06/14/2014 2341   CREATININE 1.41* 06/14/2014 2341   CALCIUM 9.0 06/14/2014 2341   PROT 7.0 06/14/2014 2341   ALBUMIN 3.2* 06/14/2014 2341   AST 15 06/14/2014 2341   ALT 6 06/14/2014 2341   ALKPHOS 62 06/14/2014 2341   BILITOT 0.2* 06/14/2014 2341   GFRNONAA 33* 06/14/2014 2341   GFRAA 38* 06/14/2014 2341    Assessment and Plan  Hypertension Cont controlled on norvasc 10 mg;plan - cont current med  Dementia with behavioral disturbance Chronic with slow decline; very few behavoirs reported; cont monitor  History of seizures Continues without seizures;plan - cont lamictal 50 mg BID    Travanti Mcmanus D. Lyn Hollingshead, MD

## 2016-02-19 ENCOUNTER — Encounter: Payer: Self-pay | Admitting: Internal Medicine

## 2016-02-19 NOTE — Assessment & Plan Note (Signed)
Continues without seizures;plan - cont lamictal 50 mg BID

## 2016-02-19 NOTE — Assessment & Plan Note (Signed)
Cont controlled on norvasc 10 mg;plan - cont current med

## 2016-02-19 NOTE — Assessment & Plan Note (Signed)
Chronic with slow decline; very few behavoirs reported; cont monitor

## 2016-03-16 ENCOUNTER — Encounter: Payer: Self-pay | Admitting: Internal Medicine

## 2016-03-16 ENCOUNTER — Non-Acute Institutional Stay (SKILLED_NURSING_FACILITY): Payer: Federal, State, Local not specified - PPO | Admitting: Internal Medicine

## 2016-03-16 DIAGNOSIS — J9611 Chronic respiratory failure with hypoxia: Secondary | ICD-10-CM | POA: Diagnosis not present

## 2016-03-16 DIAGNOSIS — M1990 Unspecified osteoarthritis, unspecified site: Secondary | ICD-10-CM | POA: Diagnosis not present

## 2016-03-16 DIAGNOSIS — K5901 Slow transit constipation: Secondary | ICD-10-CM

## 2016-03-16 NOTE — Progress Notes (Signed)
MRN: [id number] Name: Kaitlyn Curry  Sex: female Age: 82 y.o. DOB: 1928/08/14  PSC #:  [facility] / [facility] [address] Level Of Care: SNF Provider: Randon Goldsmith. Lyn Hollingshead, MD Emergency Contacts: Extended Emergency Contact Information Primary Emergency Contact: [name] [place] Home Phone: [phone] Relation: Daughter  Code Status: DNR  Allergies: Ppd and Shellfish allergy  Chief Complaint  Patient presents with  . Medical Management of Chronic Issues    Routine Visit    HPI: Patient is 82 y.o. female Hospice pt who is being seen for routine issues of chronic respiratory failure,constipation and arthritis.   Past Medical History  Diagnosis Date  . Dementia without behavioral disturbance   . Arthritis   . Gout   . Dysphagia   . PVD (peripheral vascular disease) (HCC)   . Hypertension   . Anemia   . Vitamin B12 deficiency   . Seizures (HCC)     No past surgical history on file.    Medication List       This list is accurate as of: 03/16/16 11:59 PM.  Always use your most recent med list.               acetaminophen 650 MG suppository  Commonly known as:  TYLENOL  Place 650 mg rectally every 6 (six) hours as needed for mild pain or moderate pain. Not to exceed 3000 mg in 24 hours.     allopurinol 100 MG tablet  Commonly known as:  ZYLOPRIM  Take 100 mg by mouth daily.     amLODipine 10 MG tablet  Commonly known as:  NORVASC  Take 10 mg by mouth daily.     ipratropium-albuterol 0.5-2.5 (3) MG/3ML Soln  Commonly known as:  DUONEB  Take 3 mLs by nebulization 3 (three) times daily.     lactulose 10 GM/15ML solution  Commonly known as:  CHRONULAC  Give 45 ml by mouth once daily. Make lactulose to pudding thickened before administered.     lamoTRIgine 100 MG tablet  Commonly known as:  LAMICTAL  Take 50 mg by mouth every 12 (twelve) hours.     morphine 20 MG/ML concentrated solution  Commonly known as:  ROXANOL  Take 5 mg by  mouth every 4 (four) hours as needed for severe pain.        No orders of the defined types were placed in this encounter.    Immunization History  Administered Date(s) Administered  . Influenza-Unspecified 07/08/2014, 06/22/2015    Social History  Substance Use Topics  . Smoking status: Unknown If Ever Smoked  . Smokeless tobacco: Not on file  . Alcohol Use: Not on file    Review of Systems UTO 2/2 dementia                      Filed Vitals:   03/16/16 1139  BP: 129/70  Pulse: 79  Temp: 96.9 F (36.1 C)  Resp: 16    Physical Exam  GENERAL APPEARANCE: Alert, non conversant, No acute distress  SKIN: No diaphoresis rash HEENT: Unremarkable RESPIRATORY: Breathing is even, unlabored. Lung sounds are clear   CARDIOVASCULAR: Heart RRR no murmurs, rubs or gallops. No peripheral edema  GASTROINTESTINAL: Abdomen is soft, non-tender, not distended w/ normal bowel sounds.  GENITOURINARY: Bladder non tender, not distended  MUSCULOSKELETAL: wasting and contractures all extremities NEUROLOGIC: Cranial nerves 2-12 grossly intact; paresis all extremitis PSYCHIATRIC: dementia, no behavioral issues  Patient Active Problem List  Diagnosis Date Noted  . Abrasion of buttock, left 08/05/2015  . History of seizures 04/22/2015  . Dysphagia 03/07/2015  . Encounter for family conference without patient present 01/23/2015  . Eczema 12/10/2014  . Macrocytic anemia with vitamin B12 deficiency 10/31/2014  . Anemia, iron deficiency 10/31/2014  . Constipation 09/30/2014  . Hospice care patient 09/30/2014  . Ear bleeding 08/07/2014  . Fall at nursing home 06/22/2014  . Rib pain 06/22/2014  . Tinea manuum, pedis, and unguium 06/08/2014  . Chronic respiratory failure with hypoxia (HCC) 06/08/2014  . Dementia with behavioral disturbance   . Arthritis   . Gout   . Hypertension     CBC    Component Value Date/Time   WBC 5.2 12/10/2014   WBC 5.9 06/14/2014 2341   RBC 4.38  06/14/2014 2341   HGB 10.8* 12/12/2014   HCT 35* 12/12/2014   PLT 135* 12/12/2014   MCV 79.5 06/14/2014 2341   LYMPHSABS 0.9 06/14/2014 2341   MONOABS 0.4 06/14/2014 2341   EOSABS 0.0 06/14/2014 2341   BASOSABS 0.0 06/14/2014 2341    CMP     Component Value Date/Time   NA 141 06/14/2014 2341   K 4.2 06/14/2014 2341   CL 102 06/14/2014 2341   CO2 27 06/14/2014 2341   GLUCOSE 106* 06/14/2014 2341   BUN 22 06/14/2014 2341   CREATININE 1.41* 06/14/2014 2341   CALCIUM 9.0 06/14/2014 2341   PROT 7.0 06/14/2014 2341   ALBUMIN 3.2* 06/14/2014 2341   AST 15 06/14/2014 2341   ALT 6 06/14/2014 2341   ALKPHOS 62 06/14/2014 2341   BILITOT 0.2* 06/14/2014 2341   GFRNONAA 33* 06/14/2014 2341   GFRAA 38* 06/14/2014 2341    Assessment and Plan  Chronic respiratory failure with hypoxia Pt with no reported pulmonary problems or needs for nebs or O2;plan - cont prn bebs and o2  Constipation No reported problems on scheduled chronulac;plan- cont current regimen  Arthritis No reported significant pain; pt has tylenol or morphine prn pain;plan - cont current regimen  No problem-specific assessment & plan notes found for this encounter.   Randon GoldsmithAnne D. Lyn HollingsheadAlexander, MD

## 2016-03-20 ENCOUNTER — Encounter: Payer: Self-pay | Admitting: Internal Medicine

## 2016-04-27 ENCOUNTER — Non-Acute Institutional Stay (SKILLED_NURSING_FACILITY): Payer: Federal, State, Local not specified - PPO | Admitting: Internal Medicine

## 2016-04-27 DIAGNOSIS — M7989 Other specified soft tissue disorders: Secondary | ICD-10-CM

## 2016-04-29 ENCOUNTER — Encounter: Payer: Self-pay | Admitting: Internal Medicine

## 2016-04-29 ENCOUNTER — Non-Acute Institutional Stay (SKILLED_NURSING_FACILITY): Payer: Federal, State, Local not specified - PPO | Admitting: Internal Medicine

## 2016-04-29 DIAGNOSIS — K5901 Slow transit constipation: Secondary | ICD-10-CM

## 2016-04-29 DIAGNOSIS — M1990 Unspecified osteoarthritis, unspecified site: Secondary | ICD-10-CM | POA: Diagnosis not present

## 2016-04-29 DIAGNOSIS — J9611 Chronic respiratory failure with hypoxia: Secondary | ICD-10-CM

## 2016-04-29 NOTE — Progress Notes (Signed)
MRN: [id number] Name: Kaitlyn Curry  Sex: female Age: 82 y.o. DOB: 1928-01-30  PSC #:  Facility/Room: [facility] / [address] Level Of Care: SNF Provider: Randon Goldsmith. Lyn Hollingshead, MD Emergency Contacts: Extended Emergency Contact Information Primary Emergency Contact: [name] [place] Home Phone: [phone] Relation: Daughter  Code Status: DNR  Allergies: Ppd [tuberculin purified protein derivative] and Shellfish allergy  Chief Complaint  Patient presents with  . Medical Management of Chronic Issues    Routine Visit    HPI: Patient is 82 y.o. female who is being seen for routine issues of chronic resp failure, constipation and arthritis.  Past Medical History:  Diagnosis Date  . Anemia   . Arthritis   . Dementia without behavioral disturbance   . Dysphagia   . Gout   . Hypertension   . PVD (peripheral vascular disease) (HCC)   . Seizures (HCC)   . Vitamin B12 deficiency     No past surgical history on file.    Medication List       Accurate as of 04/29/16  1:59 PM. Always use your most recent med list.          acetaminophen 650 MG suppository Commonly known as:  TYLENOL Place 650 mg rectally every 6 (six) hours as needed for mild pain or moderate pain. Not to exceed 3000 mg in 24 hours.   allopurinol 100 MG tablet Commonly known as:  ZYLOPRIM Take 100 mg by mouth daily.   amLODipine 10 MG tablet Commonly known as:  NORVASC Take 10 mg by mouth daily.   ipratropium-albuterol 0.5-2.5 (3) MG/3ML Soln Commonly known as:  DUONEB Take 3 mLs by nebulization 3 (three) times daily.   lactulose 10 GM/15ML solution Commonly known as:  CHRONULAC Give 45 ml by mouth once daily. Make lactulose to pudding thickened before administered.   lamoTRIgine 100 MG tablet Commonly known as:  LAMICTAL Take 50 mg by mouth every 12 (twelve) hours.   morphine 20 MG/ML concentrated solution Commonly known as:  ROXANOL Take 5 mg by mouth every 4 (four) hours  as needed for severe pain.       No orders of the defined types were placed in this encounter.   Immunization History  Administered Date(s) Administered  . Influenza-Unspecified 07/08/2014, 06/22/2015    Social History  Substance Use Topics  . Smoking status: Unknown If Ever Smoked  . Smokeless tobacco: Not on file  . Alcohol use Not on file    Review of Systems  UTO 2/2 non speaking status       Vitals:   04/29/16 1348  BP: 129/70  Pulse: 90  Resp: 18  Temp: 98 F (36.7 C)    Physical Exam  GENERAL APPEARANCE: Alert,non  conversant, No acute distress  SKIN: No diaphoresis rash HEENT: Unremarkable RESPIRATORY: Breathing is even, unlabored. Lung sounds are clear   CARDIOVASCULAR: Heart RRR no murmurs, rubs or gallops; LUE edema, unchanged from prior GASTROINTESTINAL: Abdomen is soft, non-tender, not distended w/ normal bowel sounds.  GENITOURINARY: Bladder non tender, not distended  MUSCULOSKELETAL: upper and LE contractures NEUROLOGIC: Cranial nerves 2-12 grossly intact; quadriplegia PSYCHIATRIC: dementia no behavioral issues  Patient Active Problem List   Diagnosis Date Noted  . Abrasion of buttock, left 08/05/2015  . History of seizures 04/22/2015  . Dysphagia 03/07/2015  . Encounter for family conference without patient present 01/23/2015  . Eczema 12/10/2014  . Macrocytic anemia with vitamin B12 deficiency 10/31/2014  . Anemia, iron deficiency 10/31/2014  .  Constipation 09/30/2014  . Hospice care patient 09/30/2014  . Ear bleeding 08/07/2014  . Fall at nursing home 06/22/2014  . Rib pain 06/22/2014  . Tinea manuum, pedis, and unguium 06/08/2014  . Chronic respiratory failure with hypoxia (HCC) 06/08/2014  . Dementia with behavioral disturbance   . Arthritis   . Gout   . Hypertension     CBC    Component Value Date/Time   WBC 5.2 12/10/2014   WBC 5.9 06/14/2014 2341   RBC 4.38 06/14/2014 2341   HGB 10.8 (A) 12/12/2014   HCT 35 (A)  12/12/2014   PLT 135 (A) 12/12/2014   MCV 79.5 06/14/2014 2341   LYMPHSABS 0.9 06/14/2014 2341   MONOABS 0.4 06/14/2014 2341   EOSABS 0.0 06/14/2014 2341   BASOSABS 0.0 06/14/2014 2341    CMP     Component Value Date/Time   NA 141 06/14/2014 2341   K 4.2 06/14/2014 2341   CL 102 06/14/2014 2341   CO2 27 06/14/2014 2341   GLUCOSE 106 (H) 06/14/2014 2341   BUN 22 06/14/2014 2341   CREATININE 1.41 (H) 06/14/2014 2341   CALCIUM 9.0 06/14/2014 2341   PROT 7.0 06/14/2014 2341   ALBUMIN 3.2 (L) 06/14/2014 2341   AST 15 06/14/2014 2341   ALT 6 06/14/2014 2341   ALKPHOS 62 06/14/2014 2341   BILITOT 0.2 (L) 06/14/2014 2341   GFRNONAA 33 (L) 06/14/2014 2341   GFRAA 38 (L) 06/14/2014 2341    Assessment and Plan   Chronic respiratory failure with hypoxia Pt has been stable without exacerbation or need for nebs;still O2 occasionally; Plan - cont O2 prn and nebs prn  Arthritis Pt has pain medication and no pain reported; Plan - continue tylenol for pain and prn morphine  Constipation No problems reported; Plan - continue scheduled lactulose  And supp prn    Shela Esses D. Lyn Hollingshead, MD

## 2016-04-30 ENCOUNTER — Encounter: Payer: Self-pay | Admitting: Internal Medicine

## 2016-04-30 IMAGING — CR DG CHEST 1V
1 series · 1 of 1 positions shown · non-contrast
Comparison: None.

CLINICAL DATA: Left rib pain.

EXAM:
CHEST - 1 VIEW

[x chest ap]
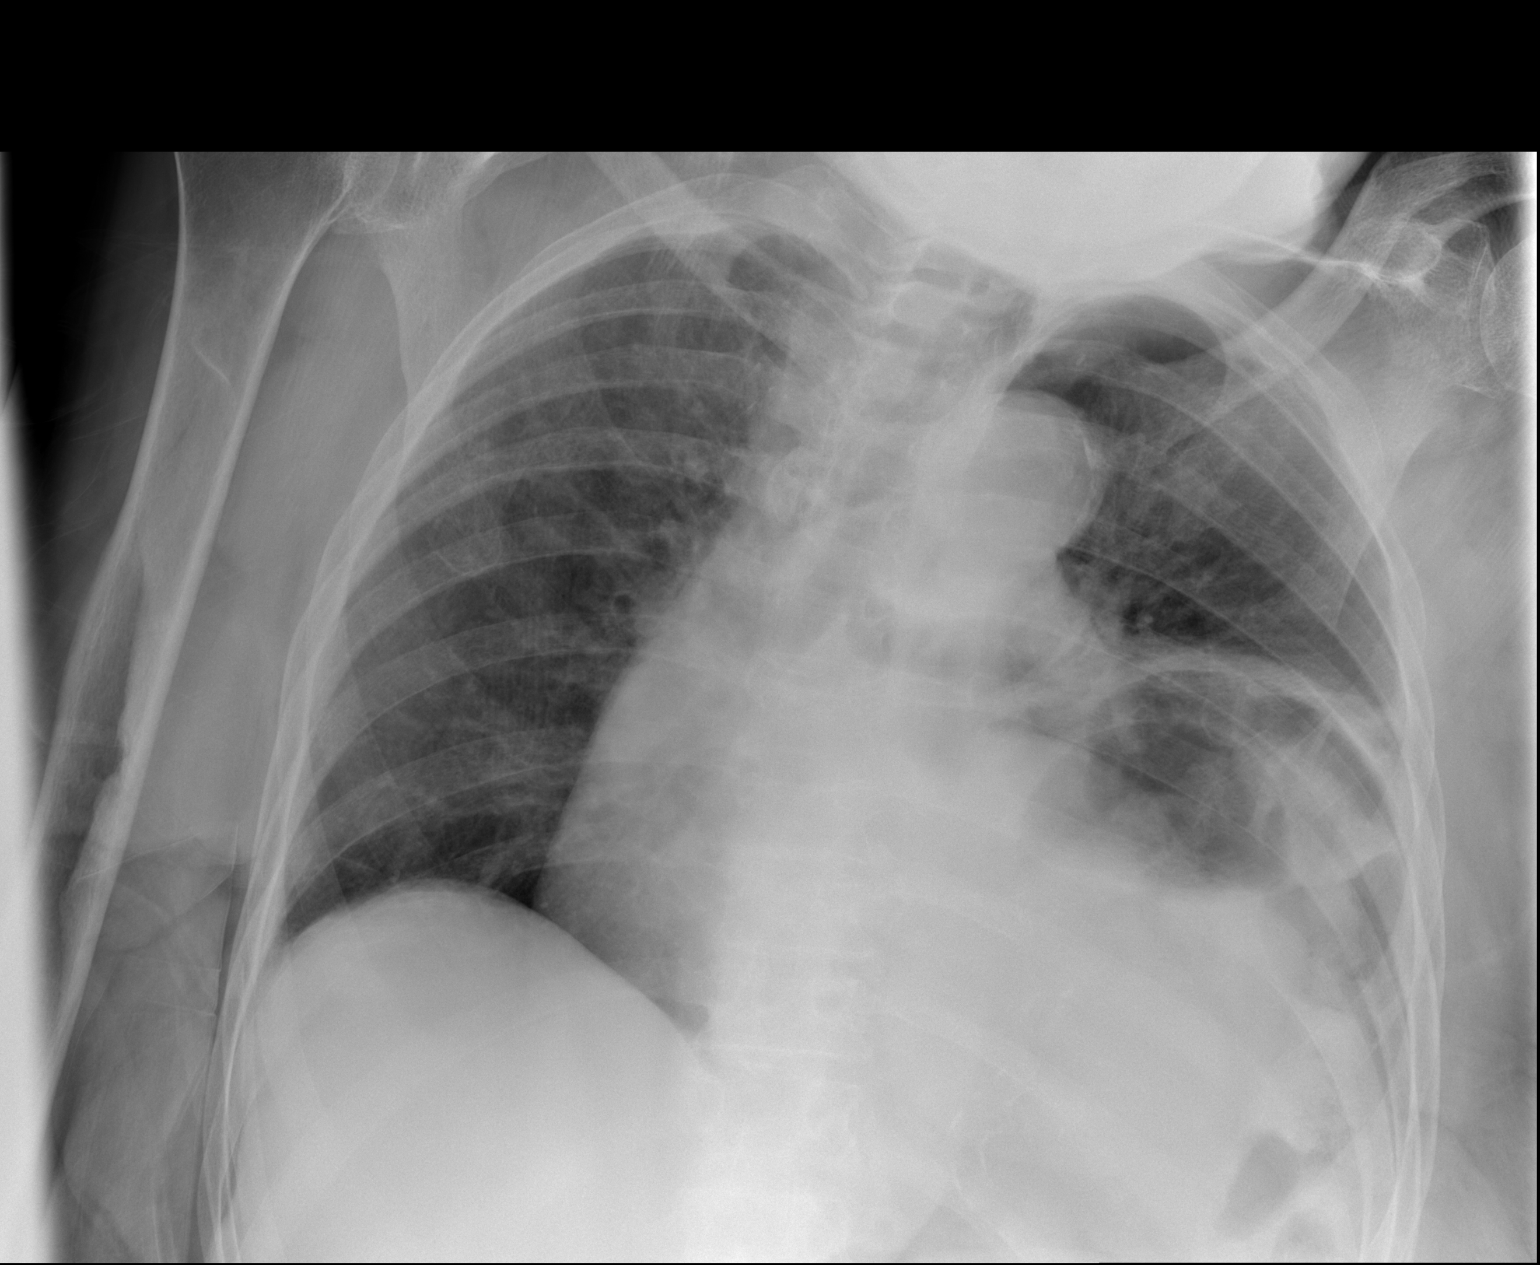

[1 of 1 positions shown; findings below may reference images not displayed]

FINDINGS: There is marked elevation of the left hemidiaphragm, with left-sided
atelectasis. The right lung appears clear. No definite pleural
effusion or pneumothorax is seen.

The cardiomediastinal silhouette is borderline normal in size. No
displaced rib fractures are seen.
IMPRESSION: Marked elevation of the left hemidiaphragm, with left-sided
atelectasis. No displaced rib fracture seen.

## 2016-04-30 IMAGING — CT CT HEAD W/O CM
2 of 7 series · 11 of 47 positions shown, 13 images · non-contrast
Comparison: None.

CLINICAL DATA: Rolled off bed; right cheek hematoma and periorbital
swelling. Concern for head or cervical spine injury.

EXAM:
CT HEAD WITHOUT CONTRAST
CT MAXILLOFACIAL WITHOUT CONTRAST
CT CERVICAL SPINE WITHOUT CONTRAST
TECHNIQUE: Multidetector CT imaging of the head, cervical spine, and
maxillofacial structures were performed using the standard protocol
without intravenous contrast. Multiplanar CT image reconstructions
of the cervical spine and maxillofacial structures were also
generated.

[Series 305: coronals st · coronal · 0.34mm/px · 8 of 58 slices shown, 10 images]
[im 7/58  brain]
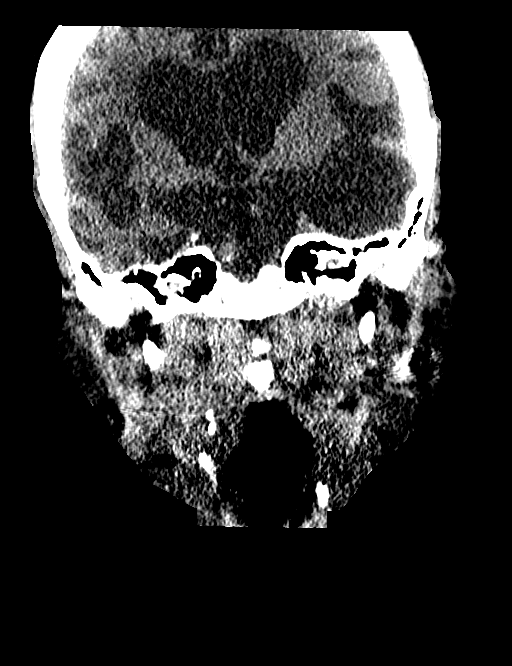
[im 7/58  bone]
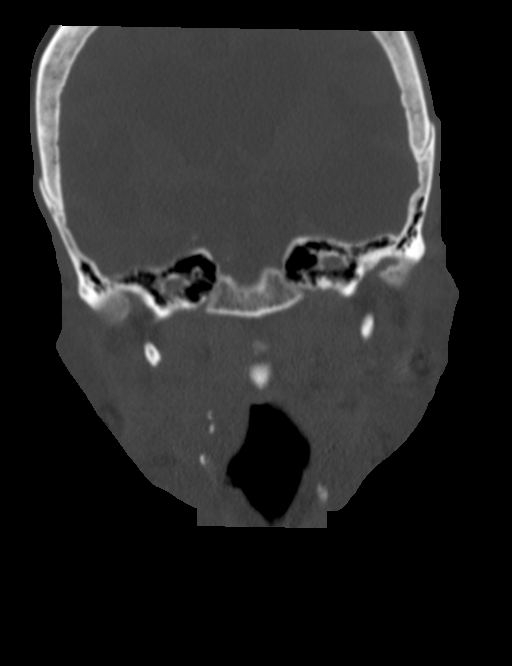
[im 13/58  brain]
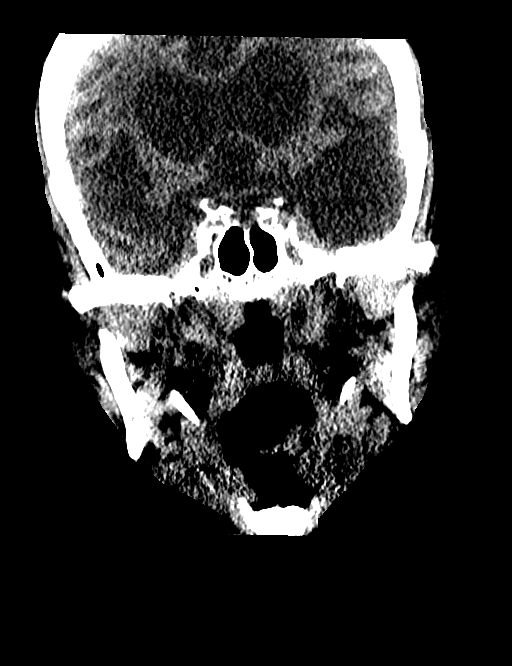
[im 20/58  brain]
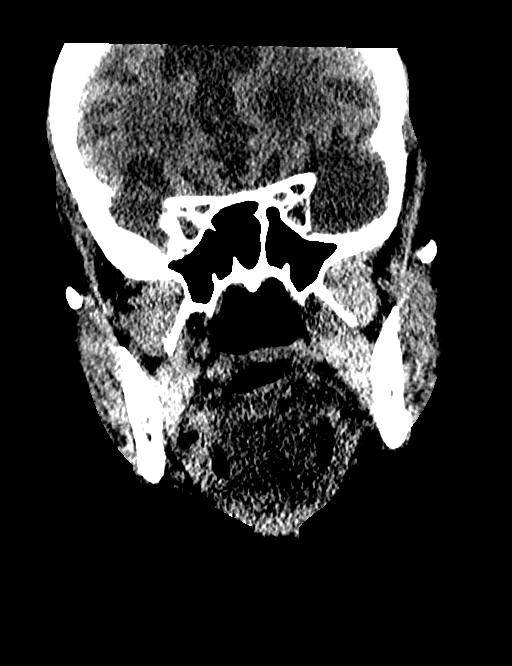
[im 26/58  brain]
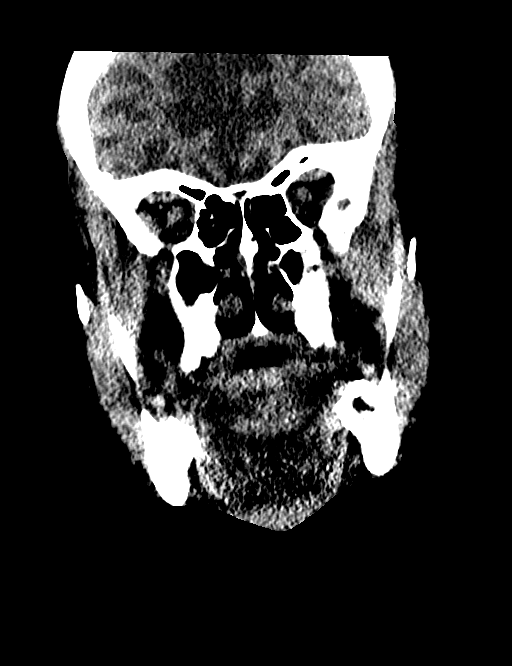
[im 32/58  brain]
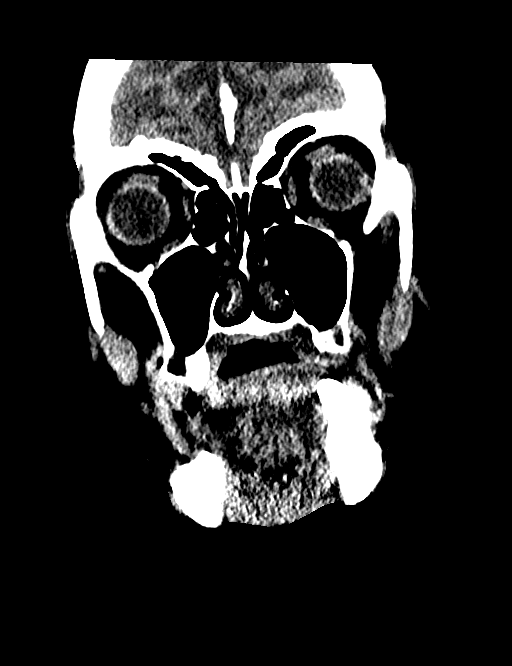
[im 32/58  bone]
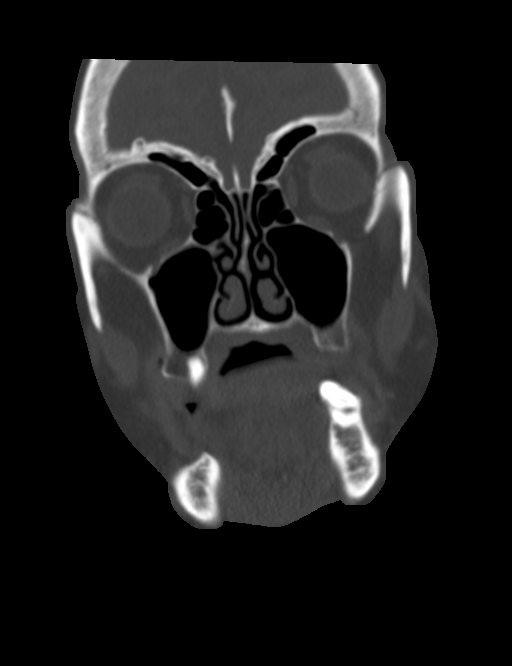
[im 39/58  brain]
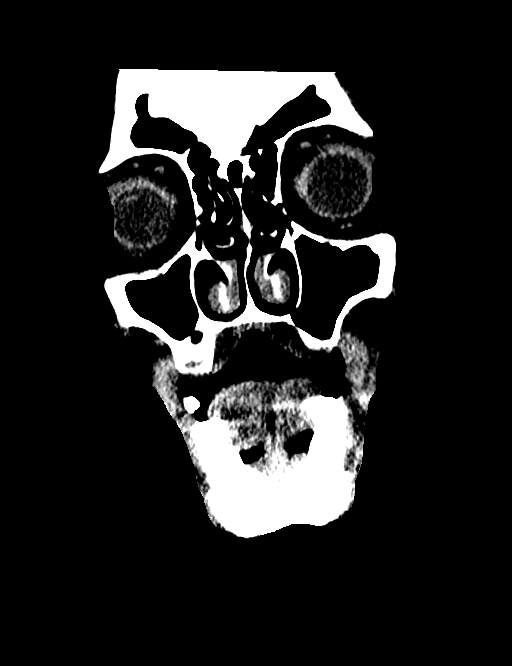
[im 45/58  brain]
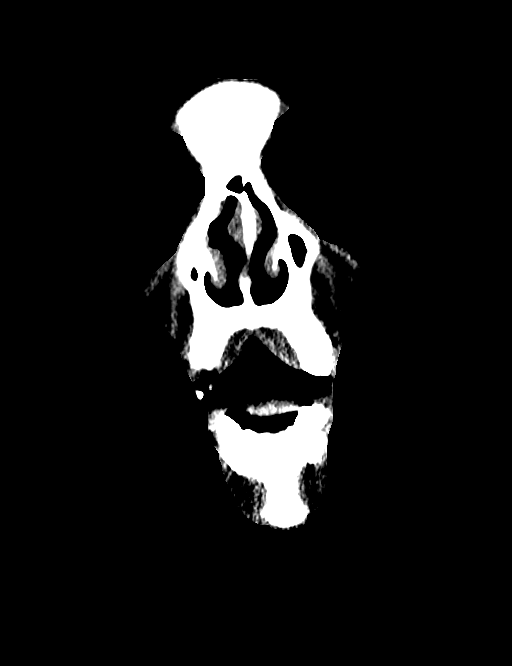
[im 51/58  brain]
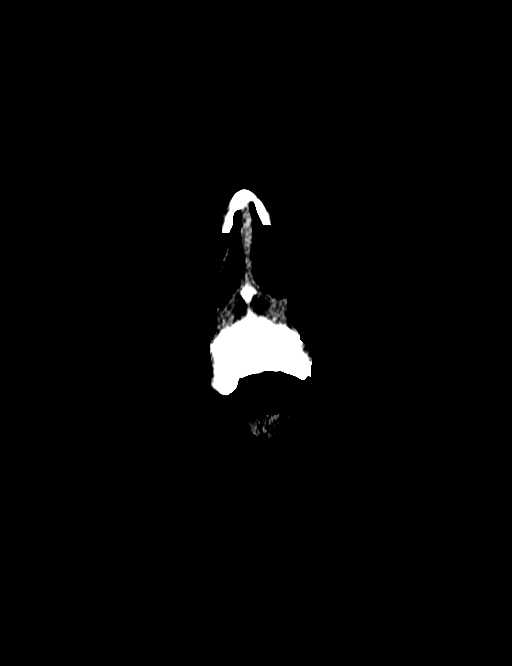

[Series 306: sagittals st · sagittal · 0.34mm/px · 3 of 67 slices shown]
[im 17/67  brain]
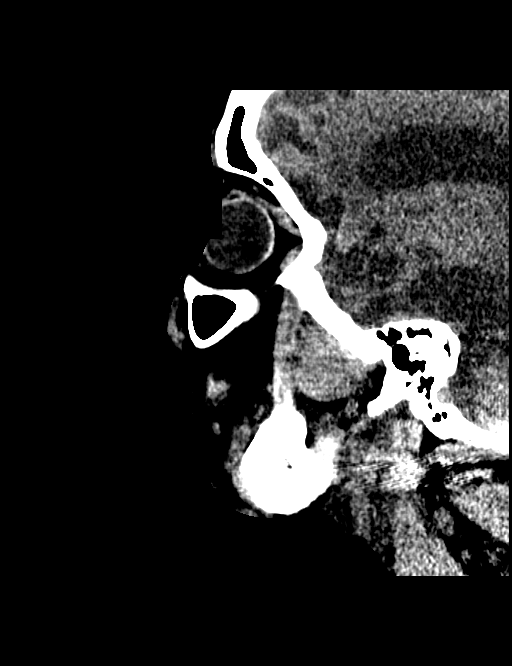
[im 34/67  brain]
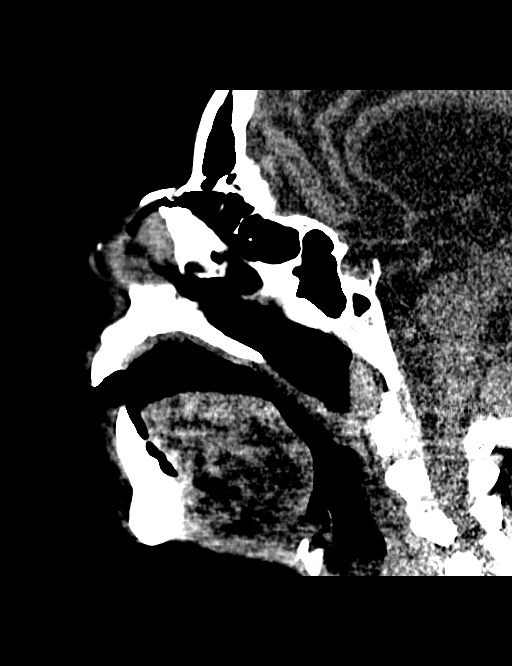
[im 50/67  brain]
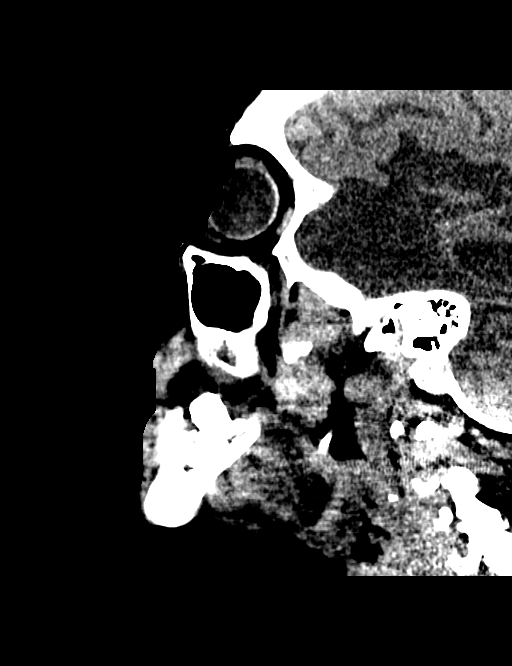

[11 of 47 positions shown; findings below may reference images not displayed]

FINDINGS: CT HEAD FINDINGS

There is no evidence of acute infarction, mass lesion, or intra- or
extra-axial hemorrhage on CT.

Prominence of the ventricles and sulci reflects moderately severe
cortical volume loss. A region of chronic encephalomalacia is noted
at the left temporal lobe. Cerebellar atrophy is noted. Diffuse
periventricular and subcortical white matter change reflects small
vessel ischemic microangiopathy.

The brainstem and fourth ventricle are within normal limits. The
basal ganglia are unremarkable in appearance. No mass effect or
midline shift is seen.

There is no evidence of fracture; a surgical defect is noted at the
high left parietal calvarium. The visualized portions of the orbits
are within normal limits. The paranasal sinuses and mastoid air
cells are well-aerated. No significant soft tissue abnormalities are
seen.

CT MAXILLOFACIAL FINDINGS

There is no evidence of fracture or dislocation. The maxilla and
mandible appear intact. The nasal bone is unremarkable in
appearance. The visualized dentition demonstrates no acute
abnormality. A tiny osseous fragment adjacent to the left
temporomandibular joint may reflect remote injury or degenerative
change.

The orbits are intact bilaterally. The visualized paranasal sinuses
and mastoid air cells are well-aerated.

There is question of a laceration at the right eyelids. The
parapharyngeal fat planes are preserved. The nasopharynx, oropharynx
and hypopharynx are unremarkable in appearance. The visualized
portions of the valleculae and piriform sinuses are grossly
unremarkable.

The parotid and submandibular glands are within normal limits. No
cervical lymphadenopathy is seen.

CT CERVICAL SPINE FINDINGS

There is no evidence of fracture or subluxation. Vertebral bodies
demonstrate normal height and alignment. Intervertebral disc spaces
are preserved. Anterior bridging osteophytes are noted at C6-C7.
Prevertebral soft tissues are within normal limits. The visualized
neural foramina are grossly unremarkable.

The thyroid gland is unremarkable in appearance. The visualized lung
apices are clear. No significant soft tissue abnormalities are seen.
IMPRESSION: 1. No evidence of traumatic intracranial injury or fracture.
2. No evidence of fracture or dislocation evident maxillofacial
structures.
3. No evidence of fracture or subluxation along the cervical spine.
4. Question of laceration at the right eyelids.
5. Moderately severe cortical volume loss. Chronic encephalomalacia
noted at the left temporal lobe. Diffuse small vessel ischemic
microangiopathy seen.
6. Tiny osseous fragment adjacent to the left temporomandibular
joint may reflect remote injury or degenerative change.

## 2016-05-12 ENCOUNTER — Encounter: Payer: Self-pay | Admitting: Internal Medicine

## 2016-05-12 NOTE — Progress Notes (Signed)
MRN: [id number] Name: Kaitlyn Curry  Sex: female Age: 82 y.o. DOB: 1928/02/03  PSC #:  Facility/Room:[facility] Level Of Care: SNF Provider: Merrilee SeashoreALEXANDER, Hortensia Duffin D Emergency Contacts: Extended Emergency Contact Information Primary Emergency Contact: Kaitlyn Curry,Kaitlyn Curry  [place] Home Phone: [phone] Relation: Daughter  Code Status:   Allergies: Ppd [tuberculin purified protein derivative] and Shellfish allergy  Chief Complaint  Patient presents with  . Acute Visit    HPI: Patient is 82 y.o. female  In semi vegetative state whose daughter is here and asked me to see her MOM for swelling L arm. Per daughter it does not appear to be causing pain. Per nursing no fever or signs of pain. Onset, not sure maybe 2 days ago.  Past Medical History:  Diagnosis Date  . Anemia   . Arthritis   . Dementia without behavioral disturbance   . Dysphagia   . Gout   . Hypertension   . PVD (peripheral vascular disease) (HCC)   . Seizures (HCC)   . Vitamin B12 deficiency     No past surgical history on file.    Medication List       Accurate as of 04/27/16 11:59 PM. Always use your most recent med list.          acetaminophen 650 MG suppository Commonly known as:  TYLENOL Place 650 mg rectally every 6 (six) hours as needed for mild pain or moderate pain. Not to exceed 3000 mg in 24 hours.   allopurinol 100 MG tablet Commonly known as:  ZYLOPRIM Take 100 mg by mouth daily.   amLODipine 10 MG tablet Commonly known as:  NORVASC Take 10 mg by mouth daily.   ipratropium-albuterol 0.5-2.5 (3) MG/3ML Soln Commonly known as:  DUONEB Take 3 mLs by nebulization 3 (three) times daily.   lactulose 10 GM/15ML solution Commonly known as:  CHRONULAC Give 45 ml by mouth once daily. Make lactulose to pudding thickened before administered.   lamoTRIgine 100 MG tablet Commonly known as:  LAMICTAL Take 50 mg by mouth every 12 (twelve) hours.   morphine 20 MG/ML concentrated  solution Commonly known as:  ROXANOL Take 5 mg by mouth every 4 (four) hours as needed for severe pain.       No orders of the defined types were placed in this encounter.   Immunization History  Administered Date(s) Administered  . Influenza-Unspecified 07/08/2014, 06/22/2015    Social History  Substance Use Topics  . Smoking status: Unknown If Ever Smoked  . Smokeless tobacco: Not on file  . Alcohol use Not on file    Review of Systems  UTO 2/2 semivegetative state; per nursing and  Daughter as per HPI     Vitals:   05/12/16 1848  BP: 129/70  Pulse: 90  Resp: 18  Temp: 98 F (36.7 C)    Physical Exam  GENERAL APPEARANCE: Eyes  Open,non  conversant, No acute distress  SKIN: No diaphoresis rash HEENT: Unremarkable RESPIRATORY: Breathing is even, unlabored. Lung sounds are clear   CARDIOVASCULAR: Heart RRR no murmurs, rubs or gallops; diffuse LUE swelling, mild;no redness or heat , no TTP GASTROINTESTINAL: Abdomen is soft, non-tender, not distended w/ normal bowel sounds.  GENITOURINARY: Bladder non tender, not distended  MUSCULOSKELETAL: No abnormal joints or musculature NEUROLOGIC: Cranial nerves 2-12 grossly intact. Moves all extremities PSYCHIATRIC: n/a, no behavioral issues  Patient Active Problem List   Diagnosis Date Noted  . Abrasion of buttock, left 08/05/2015  . History of seizures 04/22/2015  .  Dysphagia 03/07/2015  . Encounter for family conference without patient present 01/23/2015  . Eczema 12/10/2014  . Macrocytic anemia with vitamin B12 deficiency 10/31/2014  . Anemia, iron deficiency 10/31/2014  . Constipation 09/30/2014  . Hospice care patient 09/30/2014  . Ear bleeding 08/07/2014  . Fall at nursing home 06/22/2014  . Rib pain 06/22/2014  . Tinea manuum, pedis, and unguium 06/08/2014  . Chronic respiratory failure with hypoxia (HCC) 06/08/2014  . Dementia with behavioral disturbance   . Arthritis   . Gout   . Hypertension      CBC    Component Value Date/Time   WBC 5.2 12/10/2014   WBC 5.9 06/14/2014 2341   RBC 4.38 06/14/2014 2341   HGB 10.8 (A) 12/12/2014   HCT 35 (A) 12/12/2014   PLT 135 (A) 12/12/2014   MCV 79.5 06/14/2014 2341   LYMPHSABS 0.9 06/14/2014 2341   MONOABS 0.4 06/14/2014 2341   EOSABS 0.0 06/14/2014 2341   BASOSABS 0.0 06/14/2014 2341    CMP     Component Value Date/Time   NA 141 06/14/2014 2341   K 4.2 06/14/2014 2341   CL 102 06/14/2014 2341   CO2 27 06/14/2014 2341   GLUCOSE 106 (H) 06/14/2014 2341   BUN 22 06/14/2014 2341   CREATININE 1.41 (H) 06/14/2014 2341   CALCIUM 9.0 06/14/2014 2341   PROT 7.0 06/14/2014 2341   ALBUMIN 3.2 (L) 06/14/2014 2341   AST 15 06/14/2014 2341   ALT 6 06/14/2014 2341   ALKPHOS 62 06/14/2014 2341   BILITOT 0.2 (L) 06/14/2014 2341   GFRNONAA 33 (L) 06/14/2014 2341   GFRAA 38 (L) 06/14/2014 2341    Assessment and Plan  SWELLING LUE - pt has had this before;kast time it was much worse and it was U/S to r/o clot; today, reassurance and elevation on a pillow to help swelling resolve as it did before    Margit HanksALEXANDER, Magaly Pollina D, MD

## 2016-05-26 ENCOUNTER — Non-Acute Institutional Stay (SKILLED_NURSING_FACILITY): Payer: Federal, State, Local not specified - PPO | Admitting: Internal Medicine

## 2016-05-26 ENCOUNTER — Encounter: Payer: Self-pay | Admitting: Internal Medicine

## 2016-05-26 DIAGNOSIS — D509 Iron deficiency anemia, unspecified: Secondary | ICD-10-CM

## 2016-05-26 DIAGNOSIS — F0391 Unspecified dementia with behavioral disturbance: Secondary | ICD-10-CM | POA: Diagnosis not present

## 2016-05-26 DIAGNOSIS — Z87898 Personal history of other specified conditions: Secondary | ICD-10-CM

## 2016-05-26 DIAGNOSIS — I10 Essential (primary) hypertension: Secondary | ICD-10-CM

## 2016-05-26 DIAGNOSIS — M1990 Unspecified osteoarthritis, unspecified site: Secondary | ICD-10-CM

## 2016-05-26 DIAGNOSIS — R1310 Dysphagia, unspecified: Secondary | ICD-10-CM

## 2016-05-26 DIAGNOSIS — Z8669 Personal history of other diseases of the nervous system and sense organs: Secondary | ICD-10-CM

## 2016-05-26 DIAGNOSIS — L309 Dermatitis, unspecified: Secondary | ICD-10-CM

## 2016-05-26 DIAGNOSIS — F03918 Unspecified dementia, unspecified severity, with other behavioral disturbance: Secondary | ICD-10-CM

## 2016-05-26 DIAGNOSIS — K5901 Slow transit constipation: Secondary | ICD-10-CM

## 2016-05-26 DIAGNOSIS — M1000 Idiopathic gout, unspecified site: Secondary | ICD-10-CM

## 2016-05-26 NOTE — Progress Notes (Signed)
MRN: [id number] Name: Kaitlyn Curry  Sex: female Age: 82 y.o. DOB: 1928-08-07  PSC #:  Facility/Room: [facility] / [address] Level Of Care: SNF Provider: Randon Goldsmith. Lyn Hollingshead, MD Emergency Contacts: Extended Emergency Contact Information Primary Emergency Contact: [name] Home Phone: [phone] Relation: Daughter  Code Status: DNR  Allergies: Ppd [tuberculin purified protein derivative] and Shellfish allergy  Chief Complaint  Patient presents with  . Discharge Note    Discharged from SNF    HPI: Patient is 82 y.o. female Hospice care pt, with severe dementia, HTN, PVD, dysphagia and seizures who has been at [facility] 06/04/2014  for residential care. Pt is being d/c tomorrow to be [place] to SNF [place] and pt's family will be driving her there tomorrow. Pt will be given a dulcolax suppository tonight prior to travel and ativan in the am before travel.  Past Medical History:  Diagnosis Date  . Anemia   . Arthritis   . Dementia without behavioral disturbance   . Dysphagia   . Gout   . Hypertension   . PVD (peripheral vascular disease) (HCC)   . Seizures (HCC)   . Vitamin B12 deficiency     No past surgical history on file.    Medication List       Accurate as of 05/26/16  3:50 PM. Always use your most recent med list.          acetaminophen 650 MG suppository Commonly known as:  TYLENOL Place 650 mg rectally every 6 (six) hours as needed for mild pain or moderate pain. Not to exceed 3000 mg in 24 hours.   allopurinol 100 MG tablet Commonly known as:  ZYLOPRIM Take 100 mg by mouth daily.   amLODipine 10 MG tablet Commonly known as:  NORVASC Take 10 mg by mouth daily.   ipratropium-albuterol 0.5-2.5 (3) MG/3ML Soln Commonly known as:  DUONEB Take 3 mLs by nebulization 3 (three) times daily.   lactulose 10 GM/15ML solution Commonly known as:  CHRONULAC Give 45 ml by mouth once daily.  Make lactulose to pudding thickened before administered.   lamoTRIgine 100 MG tablet Commonly known as:  LAMICTAL Take 50 mg by mouth every 12 (twelve) hours.   morphine 20 MG/ML concentrated solution Commonly known as:  ROXANOL Take 5 mg by mouth every 4 (four) hours as needed for severe pain.       No orders of the defined types were placed in this encounter.   Immunization History  Administered Date(s) Administered  . Influenza-Unspecified 07/08/2014, 06/22/2015    Social History  Substance Use Topics  . Smoking status: Unknown If Ever Smoked  . Smokeless tobacco: Not on file  . Alcohol use Not on file    Vitals:   05/26/16 1140  BP: 129/70  Pulse: 77  Resp: 16  Temp: 98 F (36.7 C)    Physical Exam  GENERAL APPEARANCE: Alert, non conversant. No acute distress.  HEENT: Unremarkable. RESPIRATORY: Breathing is even, unlabored. Lung sounds are clear   CARDIOVASCULAR: Heart RRR no murmurs, rubs or gallops. No peripheral edema.  GASTROINTESTINAL: Abdomen is soft, non-tender, not distended w/ normal bowel sounds.  NEUROLOGIC: Cranial nerves 2-12 grossly intact; quadriplegia,dementia, no behavoirs  Patient Active Problem List   Diagnosis Date Noted  . Abrasion of buttock, left 08/05/2015  . History of seizures 04/22/2015  . Dysphagia 03/07/2015  . Encounter for family conference without patient present 01/23/2015  .  Eczema 12/10/2014  . Macrocytic anemia with vitamin B12 deficiency 10/31/2014  . Anemia, iron deficiency 10/31/2014  . Constipation 09/30/2014  . Hospice care patient 09/30/2014  . Ear bleeding 08/07/2014  . Fall at nursing home 06/22/2014  . Rib pain 06/22/2014  . Tinea manuum, pedis, and unguium 06/08/2014  . Chronic respiratory failure with hypoxia (HCC) 06/08/2014  . Dementia with behavioral disturbance   . Arthritis   . Gout   . Hypertension     CBC    Component Value Date/Time   WBC 5.2 12/10/2014   WBC 5.9 06/14/2014 2341   RBC  4.38 06/14/2014 2341   HGB 10.8 (A) 12/12/2014   HCT 35 (A) 12/12/2014   PLT 135 (A) 12/12/2014   MCV 79.5 06/14/2014 2341   LYMPHSABS 0.9 06/14/2014 2341   MONOABS 0.4 06/14/2014 2341   EOSABS 0.0 06/14/2014 2341   BASOSABS 0.0 06/14/2014 2341    CMP     Component Value Date/Time   NA 141 06/14/2014 2341   K 4.2 06/14/2014 2341   CL 102 06/14/2014 2341   CO2 27 06/14/2014 2341   GLUCOSE 106 (H) 06/14/2014 2341   BUN 22 06/14/2014 2341   CREATININE 1.41 (H) 06/14/2014 2341   CALCIUM 9.0 06/14/2014 2341   PROT 7.0 06/14/2014 2341   ALBUMIN 3.2 (L) 06/14/2014 2341   AST 15 06/14/2014 2341   ALT 6 06/14/2014 2341   ALKPHOS 62 06/14/2014 2341   BILITOT 0.2 (L) 06/14/2014 2341   GFRNONAA 33 (L) 06/14/2014 2341   GFRAA 38 (L) 06/14/2014 2341    Assessment and Plan  [place]. Medications have been reconciled and rx's for ativan, morphine and one lamotrigine for the trip just in case have been written.   Time spent > 30 min;> 50% of time with patient was spent reviewing records, labs, tests and studies, counseling and developing plan of care  Randon Goldsmithnne D. Lyn HollingsheadAlexander, MD

## 2016-07-27 DEATH — deceased
